# Patient Record
Sex: Male | Born: 1950
Health system: Southern US, Community
[De-identification: ages and names within clinical notes are randomized; demographics above are authoritative.]

## PROBLEM LIST (undated history)

## (undated) DIAGNOSIS — I639 Cerebral infarction, unspecified: Secondary | ICD-10-CM

## (undated) DIAGNOSIS — E785 Hyperlipidemia, unspecified: Secondary | ICD-10-CM

## (undated) DIAGNOSIS — I10 Essential (primary) hypertension: Secondary | ICD-10-CM

## (undated) DIAGNOSIS — H9190 Unspecified hearing loss, unspecified ear: Secondary | ICD-10-CM

## (undated) DIAGNOSIS — Z9189 Other specified personal risk factors, not elsewhere classified: Secondary | ICD-10-CM

## (undated) DIAGNOSIS — N189 Chronic kidney disease, unspecified: Secondary | ICD-10-CM

## (undated) DIAGNOSIS — E119 Type 2 diabetes mellitus without complications: Secondary | ICD-10-CM

## (undated) DIAGNOSIS — R7303 Prediabetes: Secondary | ICD-10-CM

## (undated) HISTORY — PX: BACK SURGERY: SHX140

---

## 2007-02-14 ENCOUNTER — Encounter: Payer: Self-pay | Admitting: Internal Medicine

## 2007-02-14 ENCOUNTER — Ambulatory Visit: Payer: Self-pay | Admitting: Internal Medicine

## 2007-02-14 ENCOUNTER — Ambulatory Visit (HOSPITAL_COMMUNITY): Admission: RE | Admit: 2007-02-14 | Discharge: 2007-02-14 | Payer: Self-pay | Admitting: Internal Medicine

## 2009-02-27 ENCOUNTER — Emergency Department (HOSPITAL_COMMUNITY): Admission: EM | Admit: 2009-02-27 | Discharge: 2009-02-27 | Payer: Self-pay | Admitting: Emergency Medicine

## 2010-02-28 ENCOUNTER — Ambulatory Visit (HOSPITAL_COMMUNITY): Admission: RE | Admit: 2010-02-28 | Discharge: 2010-02-28 | Payer: Self-pay | Admitting: Family Medicine

## 2010-04-28 ENCOUNTER — Ambulatory Visit (HOSPITAL_COMMUNITY): Admission: RE | Admit: 2010-04-28 | Discharge: 2010-04-28 | Payer: Self-pay | Admitting: Family Medicine

## 2010-11-15 NOTE — Op Note (Signed)
NAME:  Daryl Perkins, Daryl Perkins                ACCOUNT NO.:  [id number]   MEDICAL RECORD NO.:  [id number]          PATIENT TYPE:  AMB   LOCATION:  DAY                           FACILITY:  [facility]   PHYSICIAN:  R. Roetta Sessions, M.D. DATE OF BIRTH:  11/05/1950   DATE OF PROCEDURE:  02/14/2007  DATE OF DISCHARGE:                               OPERATIVE REPORT   PROCEDURE:  Colonoscopy with biopsy.   INDICATIONS FOR PROCEDURE:  60 year old African American gentleman sent  here per courtesy of Dr. Lilyan Punt for colorectal cancer screening.  He is devoid of any lower GI tract symptoms.  He has a family history of  colorectal neoplasia.  Colonoscopy is now being done.  This procedure  was discussed with the patient and family.  Potential risks, benefits  and alternatives have been reviewed.  He has never had his lower GI  tract imaged previously.   PROCEDURE NOTE:  O2 saturation, blood pressure, pulse and respirations  were monitored throughout the entire procedure, after sedation with  Versed 2 mg IV and Demerol 50 mg in a single dose.   INSTRUMENT:  Pentax videochip system.   FINDINGS:  Contingent from rectal exam revealed no abnormalities  __________   Prep was adequate.   COLON:  Colonic mucosa was surveyed from the rectosigmoid junction  through the left transverse and right colon to the appendiceal orifice.  Ileocecal valve and cecum __________ structures were well seen.   __________  the scope was slowly withdrawn.  All previous mentioned  mucosal surfaces were again seen.  The following abnormalities were  noted:  1. Left-sided diverticula.  There is a 4 mm polyp in the midascending      colon. This was cold biopsied/removed. As I mentioned, rectal      mucosa on retroflexion to the anal verge demonstrated no      abnormalities. The patient tolerated the procedure well throughout      the endoscopy.   IMPRESSION:  1. Normal rectum.  2. Left-sided diverticular diminutive polyp  ascending colon, removed      with cold biopsy technique.  Remainder of the colonic mucosa      appeared normal.   RECOMMENDATIONS:  1. Diverticulosis, literature provided to Mr. Meckes.  2. Follow-up on pathology.  3. Further recommendations to follow.      Jonathon Bellows, M.D.  Electronically Signed     RMR/MEDQ  D:  02/14/2007  T:  02/15/2007  Job:  [id number]   cc:   Lorin Picket A. Gerda Diss, MD  Fax: [phone]

## 2011-09-06 ENCOUNTER — Encounter (HOSPITAL_COMMUNITY): Payer: Self-pay

## 2011-09-06 ENCOUNTER — Other Ambulatory Visit: Payer: Self-pay

## 2011-09-06 ENCOUNTER — Emergency Department (HOSPITAL_COMMUNITY)
Admission: EM | Admit: 2011-09-06 | Discharge: 2011-09-06 | Disposition: A | Discharge status: Home / Self Care | Payer: Medicare Other | Attending: Emergency Medicine | Admitting: Emergency Medicine

## 2011-09-06 ENCOUNTER — Emergency Department (HOSPITAL_COMMUNITY): Payer: Medicare Other

## 2011-09-06 DIAGNOSIS — I498 Other specified cardiac arrhythmias: Secondary | ICD-10-CM | POA: Insufficient documentation

## 2011-09-06 DIAGNOSIS — N529 Male erectile dysfunction, unspecified: Secondary | ICD-10-CM | POA: Insufficient documentation

## 2011-09-06 LAB — BASIC METABOLIC PANEL
CO2: 24 mEq/L (ref 19–32)
Chloride: 98 mEq/L (ref 96–112)
GFR calc Af Amer: 52 mL/min — ABNORMAL LOW (ref >90)
Potassium: 3.9 mEq/L (ref 3.5–5.1)

## 2011-09-06 LAB — CBC
HCT: 36.4 % — ABNORMAL LOW (ref 39.0–52.0)
Hemoglobin: 12.2 g/dL — ABNORMAL LOW (ref 13.0–17.0)
WBC: 7.8 10*3/uL (ref 4.0–10.5)

## 2011-09-06 NOTE — ED Notes (Signed)
EMD currently at bedside. 

## 2011-09-06 NOTE — ED Notes (Signed)
Pt here with complaints of recently having problems with "erection" per pt. Pt states problem started a year ago and his wife is concerned he is cheating on her since he is unable to perform at home per pt. Denies any other difficulties.

## 2011-09-06 NOTE — ED Provider Notes (Signed)
History   This chart was scribed for Shelda Jakes, MD by Clarita Crane. The patient was seen in room APFT22/APFT22. Patient's care was started at 0904.    CSN: [id number]  Arrival date & time 09/06/11  0904   First MD Initiated Contact with Patient 09/06/11 [phone]      Chief Complaint  Patient presents with  . Erectile Dysfunction    (Consider location/radiation/quality/duration/timing/severity/associated sxs/prior treatment) HPI Daryl Perkins is a 61 y.o. male who presents to the Emergency Department complaining of intermittent erectile dysfunction onset 1 year ago and persistent since with no associated symptoms. Denies penile pain, discharge. Patient with no pertinent past medical history.   History reviewed. No pertinent past medical history.  History reviewed. No pertinent past surgical history.  No family history on file.  History  Substance Use Topics  . Smoking status: Never Smoker   . Smokeless tobacco: Not on file  . Alcohol Use: No      Review of Systems  Constitutional: Negative for fever and chills.  HENT: Negative for sore throat and rhinorrhea.   Respiratory: Negative for cough and shortness of breath.   Cardiovascular: Negative for chest pain.  Gastrointestinal: Negative for nausea, vomiting, abdominal pain and diarrhea.  Genitourinary: Negative for dysuria.       Erectile dysfunction.   Musculoskeletal: Negative for back pain.  Skin: Negative for rash.  Neurological: Negative for dizziness and weakness.    Allergies  Review of patient's allergies indicates no known allergies.  Home Medications  No current outpatient prescriptions on file.  BP 105/92  Pulse 59  Temp(Src) 97.4 F (36.3 C) (Oral)  Resp 16  SpO2 100%  Physical Exam  Nursing note and vitals reviewed. Constitutional: He is oriented to person, place, and time. He appears well-developed and well-nourished. No distress.  HENT:  Head: Normocephalic and atraumatic.  Eyes: EOM  are normal. Pupils are equal, round, and reactive to light.  Neck: Neck supple. No tracheal deviation present.  Cardiovascular: Normal rate and regular rhythm.  Exam reveals no gallop and no friction rub.   No murmur heard. Pulmonary/Chest: Effort normal. No respiratory distress. He has no wheezes. He has no rales.  Abdominal: Soft. Bowel sounds are normal. He exhibits no distension.  Genitourinary: Penis normal. No penile tenderness.       Chaperone present for exam. Testicles normal. Uncircumcised. No masses or hernia noted.   Musculoskeletal: Normal range of motion. He exhibits no edema.  Lymphadenopathy:    He has no cervical adenopathy.  Neurological: He is alert and oriented to person, place, and time. No cranial nerve deficit or sensory deficit.  Skin: Skin is warm and dry.  Psychiatric: He has a normal mood and affect. His behavior is normal.    ED Course  Procedures (including critical care time)  DIAGNOSTIC STUDIES: Oxygen Saturation is 100% on room air, normal by my interpretation.    COORDINATION OF CARE: 10:10AM- Patient informed of current plan for treatment and evaluation and agrees with plan at this time.    Date: 09/06/2011  Rate: 56  Rhythm: sinus bradycardia  QRS Axis: normal  Intervals: normal  ST/T Wave abnormalities: nonspecific ST changes  Conduction Disutrbances:none  Narrative Interpretation:   Old EKG Reviewed: none available    Labs Reviewed  BASIC METABOLIC PANEL - Abnormal; Notable for the following:    Sodium 133 (*)    Glucose, Bld 109 (*)    Creatinine, Ser 1.63 (*)    GFR calc non  Af Amer 45 (*)    GFR calc Af Amer 52 (*)    All other components within normal limits  CBC - Abnormal; Notable for the following:    Hemoglobin 12.2 (*)    HCT 36.4 (*)    All other components within normal limits   Dg Chest 2 View  09/06/2011  *RADIOLOGY REPORT*  Clinical Data: Erectile dysfunction.  CHEST - 2 VIEW  Comparison: 02/28/2010  Findings: Heart  and mediastinal contours are within normal limits. No focal opacities or effusions.  No acute bony abnormality. Degenerative changes in the thoracic spine.  IMPRESSION: No active cardiopulmonary disease.  Original Report Authenticated By: Cyndie Chime, M.D.   Results for orders placed during the hospital encounter of 09/06/11  BASIC METABOLIC PANEL      Component Value Range   Sodium 133 (*) 135 - 145 (mEq/L)   Potassium 3.9  3.5 - 5.1 (mEq/L)   Chloride 98  96 - 112 (mEq/L)   CO2 24  19 - 32 (mEq/L)   Glucose, Bld 109 (*) 70 - 99 (mg/dL)   BUN 18  6 - 23 (mg/dL)   Creatinine, Ser 4.54 (*) 0.50 - 1.35 (mg/dL)   Calcium 09.8  8.4 - 10.5 (mg/dL)   GFR calc non Af Amer 45 (*) >90 (mL/min)   GFR calc Af Amer 52 (*) >90 (mL/min)  CBC      Component Value Range   WBC 7.8  4.0 - 10.5 (K/uL)   RBC 4.22  4.22 - 5.81 (MIL/uL)   Hemoglobin 12.2 (*) 13.0 - 17.0 (g/dL)   HCT 11.9 (*) 14.7 - 52.0 (%)   MCV 86.3  78.0 - 100.0 (fL)   MCH 28.9  26.0 - 34.0 (pg)   MCHC 33.5  30.0 - 36.0 (g/dL)   RDW 82.9  56.2 - 13.0 (%)   Platelets 283  150 - 400 (K/uL)     1. Erectile dysfunction       MDM  Workup clinically suggestive of erectile dysfunction will need further evaluation to see if appropriate to start medicines like Viagra. GU exam was normal normal phallus uncircumcised no hernia no scrotal mass no discharge. No epididymal tenderness suggestive of epididymitis.      I personally performed the services described in this documentation, which was scribed in my presence. The recorded information has been reviewed and considered.     Shelda Jakes, MD 09/06/11 [phone]

## 2011-09-06 NOTE — Discharge Instructions (Signed)
Basic workup here without any specific findings EKG chest x-ray and basic labs without any significant abnormalities. Diagnosis of erectile dysfunction we'll require further workup by your primary care physician. Make an appointment with them to be seen shortly. If appropriate they may be able to recommend Viagra-type medicine to help.  Erectile Dysfunction Erectile dysfunction (ED) is the inability to get a good enough erection to have sexual intercourse. ED may involve:  Inability to get an erection.   Lack of enough hardness to allow penetration.   Loss of the erection before sex is finished.   Premature ejaculation.   Any combination of these problems if they occur more than 25% of the time.  CAUSES  Certain drugs, such as:   Pain relievers.   Antihistamines.   Antidepressants.   Blood pressure medicines.   Water pills.   Ulcer medicines.   Muscle relaxants.   Illegal drugs.   Excessive drinking.   Psychological causes, such as:   Anxiety.   Depression.   Sadness.   Exhaustion.   Performance fear.   Stress.   Physical causes, such as:   Artery problems. This may include diabetes, smoking, liver disease, or atherosclerosis.   High blood pressure.   Hormonal problems, such as low testosterone.   Obesity.   Nerve problems. This may include back or pelvic injuries, diabetes, multiple sclerosis, Parkinson's disease, or some surgeries.  SYMPTOMS  Inability to get an erection.   Lack of enough hardness to allow penetration.   Loss of the erection before sex is finished.   Premature ejaculation.   Normal erections at some times, but with frequent unsatisfactory episodes.   Orgasms that are not satisfactory in sensation or frequency.   Low sexual satisfaction in either partner because of erection problems.   A curved penis occurring with erection. The curve may cause pain or may be too curved to allow for intercourse.   Never having nighttime  erections.  DIAGNOSIS Your caregiver can often diagnose this condition by:  Performing a physical exam to find other diseases or specific problems with the penis.   Asking you detailed questions about the problem.   Performing blood tests to check for diabetes or to measure hormone levels.   Performing urine tests to find other underlying health conditions.   Performing an ultrasound to check for scarring.   Performing a test to check blood flow to the penis.   Doing a sleep study at home to measure nighttime erections.  TREATMENT   You may be prescribed medicines by mouth.   You may be given medicine injections into the penis.   You may be prescribed a vacuum pump with a ring.   Penile implant surgery may be performed. You may receive:   An inflatable implant.   A semi-rigid implant.   Blood vessel surgery may be performed.  HOME CARE INSTRUCTIONS  Take all medicine as directed by your caregiver. Do not take any other medicines without talking to your caregiver first.   Follow your caregiver's directions for specific treatments as prescribed.   Follow up with your caregiver as directed.  Document Released: 06/16/2000 Document Revised: 06/08/2011 Document Reviewed: 10/09/2010 [facility] Patient Information 2012 [place], [place].

## 2012-09-05 ENCOUNTER — Encounter (HOSPITAL_COMMUNITY): Payer: Self-pay

## 2012-09-05 ENCOUNTER — Encounter (HOSPITAL_COMMUNITY): Payer: Self-pay | Admitting: Pharmacy Technician

## 2012-09-05 ENCOUNTER — Encounter (HOSPITAL_COMMUNITY)
Admission: RE | Admit: 2012-09-05 | Discharge: 2012-09-05 | Disposition: A | Discharge status: Home / Self Care | Payer: Medicare Other | Source: Ambulatory Visit | Attending: General Surgery | Admitting: General Surgery

## 2012-09-05 HISTORY — DX: Unspecified hearing loss, unspecified ear: H91.90

## 2012-09-05 HISTORY — DX: Essential (primary) hypertension: I10

## 2012-09-05 LAB — CBC WITH DIFFERENTIAL/PLATELET
Eosinophils Relative: 3 % (ref 0–5)
HCT: 34.5 % — ABNORMAL LOW (ref 39.0–52.0)
Hemoglobin: 11.7 g/dL — ABNORMAL LOW (ref 13.0–17.0)
Lymphocytes Relative: 30 % (ref 12–46)
Lymphs Abs: 2.2 10*3/uL (ref 0.7–4.0)
MCV: 86.5 fL (ref 78.0–100.0)
Monocytes Absolute: 0.5 10*3/uL (ref 0.1–1.0)
Platelets: 228 10*3/uL (ref 150–400)
RBC: 3.99 MIL/uL — ABNORMAL LOW (ref 4.22–5.81)
WBC: 7.2 10*3/uL (ref 4.0–10.5)

## 2012-09-05 LAB — SURGICAL PCR SCREEN: Staphylococcus aureus: NEGATIVE

## 2012-09-05 LAB — BASIC METABOLIC PANEL
CO2: 26 mEq/L (ref 19–32)
Chloride: 100 mEq/L (ref 96–112)
Potassium: 4.1 mEq/L (ref 3.5–5.1)
Sodium: 135 mEq/L (ref 135–145)

## 2012-09-05 NOTE — Patient Instructions (Addendum)
Daryl Perkins  09/05/2012   Your procedure is scheduled on:  09/09/12  Report to [facility] at  615 AM.  Call this number if you have problems the morning of surgery: [phone]   Remember:   Do not eat food or drink liquids after midnight.   Take these medicines the morning of surgery with A SIP OF WATER: none   Do not wear jewelry, make-up or nail polish.  Do not wear lotions, powders, or perfumes.   Do not shave 48 hours prior to surgery. Men may shave face and neck.  Do not bring valuables to the hospital.  Contacts, dentures or bridgework may not be worn into surgery.  Leave suitcase in the car. After surgery it may be brought to your room.  For patients admitted to the hospital, checkout time is 11:00 AM the day of discharge.   Patients discharged the day of surgery will not be allowed to drive  home.  Name and phone number of your driver: family Special Instructions: Shower using CHG 2 nights before surgery and the night before surgery.  If you shower the day of surgery use CHG.  Use special wash - you have one bottle of CHG for all showers.  You should use approximately 1/3 of the bottle for each shower.   Please read over the following fact sheets that you were given: Pain Booklet, Coughing and Deep Breathing, MRSA Information, Surgical Site Infection Prevention, Anesthesia Post-op Instructions and Care and Recovery After Surgery Hernia A hernia occurs when an internal organ pushes out through a weak spot in the abdominal wall. Hernias most commonly occur in the groin and around the navel. Hernias often can be pushed back into place (reduced). Most hernias tend to get worse over time. Some abdominal hernias can get stuck in the opening (irreducible or incarcerated hernia) and cannot be reduced. An irreducible abdominal hernia which is tightly squeezed into the opening is at risk for impaired blood supply (strangulated hernia). A strangulated hernia is a medical emergency.  Because of the risk for an irreducible or strangulated hernia, surgery may be recommended to repair a hernia. CAUSES   Heavy lifting.  Prolonged coughing.  Straining to have a bowel movement.  A cut (incision) made during an abdominal surgery. HOME CARE INSTRUCTIONS   Bed rest is not required. You may continue your normal activities.  Avoid lifting more than 10 pounds (4.5 kg) or straining.  Cough gently. If you are a smoker it is best to stop. Even the best hernia repair can break down with the continual strain of coughing. Even if you do not have your hernia repaired, a cough will continue to aggravate the problem.  Do not wear anything tight over your hernia. Do not try to keep it in with an outside bandage or truss. These can damage abdominal contents if they are trapped within the hernia sac.  Eat a normal diet.  Avoid constipation. Straining over long periods of time will increase hernia size and encourage breakdown of repairs. If you cannot do this with diet alone, stool softeners may be used. SEEK IMMEDIATE MEDICAL CARE IF:   You have a fever.  You develop increasing abdominal pain.  You feel nauseous or vomit.  Your hernia is stuck outside the abdomen, looks discolored, feels hard, or is tender.  You have any changes in your bowel habits or in the hernia that are unusual for you.  You have increased pain or swelling  around the hernia.  You cannot push the hernia back in place by applying gentle pressure while lying down. MAKE SURE YOU:   Understand these instructions.  Will watch your condition.  Will get help right away if you are not doing well or get worse. Document Released: 06/19/2005 Document Revised: 09/11/2011 Document Reviewed: 02/06/2008 [facility] Patient Information 2013 [place], [place]. PATIENT INSTRUCTIONS POST-ANESTHESIA  IMMEDIATELY FOLLOWING SURGERY:  Do not drive or operate machinery for the first twenty four hours after surgery.  Do not make  any important decisions for twenty four hours after surgery or while taking narcotic pain medications or sedatives.  If you develop intractable nausea and vomiting or a severe headache please notify your doctor immediately.  FOLLOW-UP:  Please make an appointment with your surgeon as instructed. You do not need to follow up with anesthesia unless specifically instructed to do so.  WOUND CARE INSTRUCTIONS (if applicable):  Keep a dry clean dressing on the anesthesia/puncture wound site if there is drainage.  Once the wound has quit draining you may leave it open to air.  Generally you should leave the bandage intact for twenty four hours unless there is drainage.  If the epidural site drains for more than 36-48 hours please call the anesthesia department.  QUESTIONS?:  Please feel free to call your physician or the hospital operator if you have any questions, and they will be happy to assist you.

## 2012-09-05 NOTE — H&P (Signed)
  NTS SOAP Note  Vital Signs:  Vitals as of: 09/05/2012: Systolic 134: Diastolic 57: Heart Rate 55: Temp 99.17F: Height 62ft 11in: Weight 200Lbs 0 Ounces: Pain Level 10: BMI 28  BMI : 27.89 kg/m2  Subjective: This 7 Years 67 Months old Male presents forsymptoms of    HERNIA: ,.  Has had a right inguinal hernia for some time, but is increasing in size and causing him discomfort.  Made worse with straining.  Review of Symptoms:  Constitutional:unremarkable   Head:unremarkable    Eyes:unremarkable   Nose/Mouth/Throat:unremarkable Cardiovascular:  unremarkable   Respiratory:unremarkable   Gastrointestinal:  unremarkable   Genitourinary:unremarkable       back Skin:unremarkable Hematolgic/Lymphatic:unremarkable     Allergic/Immunologic:unremarkable     Past Medical History:    Reviewed   Past Medical History  Surgical History: back surgeries Medical Problems:  Diabetes, High Blood pressure, High cholesterol Allergies: nkda Medications: lisinopri, pravastatin, metformin, doxazosin, metoprolol, dyazide   Social History:Reviewed  Social History  Preferred Language: English Race:  Black or African American Ethnicity: Not Hispanic / Latino Age: 62 Years 4 Months Marital Status:  M Alcohol:  No Recreational drug(s):  No   Smoking Status: Never smoker reviewed on 09/05/2012 Functional Status reviewed on mm/dd/yyyy ------------------------------------------------ Bathing: Normal Cooking: Normal Dressing: Normal Driving: Normal Eating: Normal Managing Meds: Normal Oral Care: Normal Shopping: Normal Toileting: Normal Transferring: Normal Walking: Normal Cognitive Status reviewed on mm/dd/yyyy ------------------------------------------------ Attention: Normal Decision Making: Normal Language: Normal Memory: Normal Motor: Normal Perception: Normal Problem Solving: Normal Visual and Spatial: Normal   Family History:   Reviewed   Family History  Is there a family history of:DM, HTN, reflux disease    Objective Information: General:  Well appearing, well nourished in no distress. Neck:  Supple without lymphadenopathy.  Heart:  RRR, no murmur Lungs:    CTA bilaterally, no wheezes, rhonchi, rales.  Breathing unlabored. Abdomen:Soft, NT/ND, no HSM, no masses.  Reducible right inguinal hernia, small left inguinal hernia [id number]    Assessment:Right inguinal hernia  Diagnosis &amp; Procedure:    Plan:Scheduled for right inguinal herniorrhaphy for  09/09/12.   Patient Education:Alternative treatments to surgery were discussed with patient (and family).  Risks and benefits  of procedure including pain and recurrence of the hernia were fully explained to the patient (and family) who gave informed consent. Patient/family questions were addressed.  Follow-up:Pending Surgery

## 2012-09-09 ENCOUNTER — Encounter (HOSPITAL_COMMUNITY): Payer: Self-pay | Admitting: Anesthesiology

## 2012-09-09 ENCOUNTER — Encounter (HOSPITAL_COMMUNITY)
Admission: RE | Disposition: A | Discharge status: Home / Self Care | Payer: Self-pay | Source: Ambulatory Visit | Attending: General Surgery

## 2012-09-09 ENCOUNTER — Ambulatory Visit (HOSPITAL_COMMUNITY): Payer: Medicare Other | Admitting: Anesthesiology

## 2012-09-09 ENCOUNTER — Ambulatory Visit (HOSPITAL_COMMUNITY)
Admission: RE | Admit: 2012-09-09 | Discharge: 2012-09-09 | Disposition: A | Discharge status: Home / Self Care | Payer: Medicare Other | Source: Ambulatory Visit | Attending: General Surgery | Admitting: General Surgery

## 2012-09-09 ENCOUNTER — Encounter (HOSPITAL_COMMUNITY): Payer: Self-pay | Admitting: *Deleted

## 2012-09-09 DIAGNOSIS — K4090 Unilateral inguinal hernia, without obstruction or gangrene, not specified as recurrent: Secondary | ICD-10-CM | POA: Insufficient documentation

## 2012-09-09 DIAGNOSIS — Z01812 Encounter for preprocedural laboratory examination: Secondary | ICD-10-CM | POA: Insufficient documentation

## 2012-09-09 HISTORY — DX: Other specified personal risk factors, not elsewhere classified: Z91.89

## 2012-09-09 HISTORY — PX: INGUINAL HERNIA REPAIR: SHX194

## 2012-09-09 HISTORY — PX: INSERTION OF MESH: SHX5868

## 2012-09-09 HISTORY — DX: Type 2 diabetes mellitus without complications: E11.9

## 2012-09-09 LAB — GLUCOSE, CAPILLARY
Glucose-Capillary: 109 mg/dL — ABNORMAL HIGH (ref 70–99)
Glucose-Capillary: 90 mg/dL (ref 70–99)

## 2012-09-09 SURGERY — REPAIR, HERNIA, INGUINAL, ADULT
Anesthesia: Spinal | Site: Groin | Laterality: Right | Wound class: Clean

## 2012-09-09 MED ORDER — KETOROLAC TROMETHAMINE 30 MG/ML IJ SOLN
INTRAMUSCULAR | Status: AC
  Filled 2012-09-09: qty 1

## 2012-09-09 MED ORDER — LIDOCAINE IN DEXTROSE 5-7.5 % IV SOLN
INTRAVENOUS | Status: DC | PRN
  Administered 2012-09-09: 2 mL via INTRATHECAL

## 2012-09-09 MED ORDER — CEFAZOLIN SODIUM-DEXTROSE 2-3 GM-% IV SOLR
INTRAVENOUS | Status: AC
  Filled 2012-09-09: qty 50

## 2012-09-09 MED ORDER — LIDOCAINE HCL (CARDIAC) 10 MG/ML IV SOLN
INTRAVENOUS | Status: DC | PRN
  Administered 2012-09-09: 50 mg via INTRAVENOUS

## 2012-09-09 MED ORDER — EPHEDRINE SULFATE 50 MG/ML IJ SOLN
INTRAMUSCULAR | Status: AC
  Filled 2012-09-09: qty 1

## 2012-09-09 MED ORDER — PROPOFOL 10 MG/ML IV EMUL
INTRAVENOUS | Status: AC
  Filled 2012-09-09: qty 20

## 2012-09-09 MED ORDER — LACTATED RINGERS IV SOLN
INTRAVENOUS | Status: DC
  Administered 2012-09-09: 07:00:00 via INTRAVENOUS

## 2012-09-09 MED ORDER — LACTATED RINGERS IV SOLN
INTRAVENOUS | Status: DC | PRN
  Administered 2012-09-09: 07:00:00 via INTRAVENOUS

## 2012-09-09 MED ORDER — ENOXAPARIN SODIUM 40 MG/0.4ML ~~LOC~~ SOLN
SUBCUTANEOUS | Status: AC
  Filled 2012-09-09: qty 0.4

## 2012-09-09 MED ORDER — CEFAZOLIN SODIUM-DEXTROSE 2-3 GM-% IV SOLR: 2.0000 g | INTRAVENOUS | Status: DC

## 2012-09-09 MED ORDER — MIDAZOLAM HCL 2 MG/2ML IJ SOLN
INTRAMUSCULAR | Status: AC
  Filled 2012-09-09: qty 2

## 2012-09-09 MED ORDER — LIDOCAINE HCL (PF) 1 % IJ SOLN
INTRAMUSCULAR | Status: AC
  Filled 2012-09-09: qty 5

## 2012-09-09 MED ORDER — CHLORHEXIDINE GLUCONATE 4 % EX LIQD: 1.0000 "application " | Freq: Once | CUTANEOUS | Status: DC

## 2012-09-09 MED ORDER — HYDROCODONE-ACETAMINOPHEN 5-325 MG PO TABS: 1.0000 | ORAL_TABLET | ORAL | Status: DC | PRN

## 2012-09-09 MED ORDER — ENOXAPARIN SODIUM 40 MG/0.4ML ~~LOC~~ SOLN
40.0000 mg | Freq: Once | SUBCUTANEOUS | Status: AC
  Administered 2012-09-09: 40 mg via SUBCUTANEOUS

## 2012-09-09 MED ORDER — FENTANYL CITRATE 0.05 MG/ML IJ SOLN
25.0000 ug | INTRAMUSCULAR | Status: DC | PRN
  Administered 2012-09-09 (×2): 50 ug via INTRAVENOUS

## 2012-09-09 MED ORDER — FENTANYL CITRATE 0.05 MG/ML IJ SOLN
INTRAMUSCULAR | Status: AC
  Filled 2012-09-09: qty 2

## 2012-09-09 MED ORDER — CEFAZOLIN SODIUM-DEXTROSE 2-3 GM-% IV SOLR
INTRAVENOUS | Status: DC | PRN
  Administered 2012-09-09: 2 g via INTRAVENOUS

## 2012-09-09 MED ORDER — MIDAZOLAM HCL 2 MG/2ML IJ SOLN
1.0000 mg | INTRAMUSCULAR | Status: DC | PRN
  Administered 2012-09-09: 2 mg via INTRAVENOUS

## 2012-09-09 MED ORDER — BUPIVACAINE HCL (PF) 0.5 % IJ SOLN
INTRAMUSCULAR | Status: DC | PRN
  Administered 2012-09-09: 10 mL

## 2012-09-09 MED ORDER — GLYCOPYRROLATE 0.2 MG/ML IJ SOLN
INTRAMUSCULAR | Status: DC | PRN
  Administered 2012-09-09: 0.2 mg via INTRAVENOUS

## 2012-09-09 MED ORDER — KETOROLAC TROMETHAMINE 30 MG/ML IJ SOLN
30.0000 mg | Freq: Once | INTRAMUSCULAR | Status: AC
  Administered 2012-09-09: 30 mg via INTRAVENOUS

## 2012-09-09 MED ORDER — SODIUM CHLORIDE 0.9 % IR SOLN
Status: DC | PRN
  Administered 2012-09-09: 1000 mL

## 2012-09-09 MED ORDER — GLYCOPYRROLATE 0.2 MG/ML IJ SOLN
INTRAMUSCULAR | Status: AC
  Filled 2012-09-09: qty 1

## 2012-09-09 MED ORDER — ONDANSETRON HCL 4 MG/2ML IJ SOLN: 4.0000 mg | Freq: Once | INTRAMUSCULAR | Status: DC | PRN

## 2012-09-09 MED ORDER — FENTANYL CITRATE 0.05 MG/ML IJ SOLN
INTRAMUSCULAR | Status: DC | PRN
  Administered 2012-09-09: 20 ug via INTRATHECAL
  Administered 2012-09-09: 30 ug via INTRAVENOUS
  Administered 2012-09-09 (×2): 25 ug via INTRAVENOUS

## 2012-09-09 MED ORDER — ARTIFICIAL TEARS OP OINT
TOPICAL_OINTMENT | OPHTHALMIC | Status: AC
  Filled 2012-09-09: qty 3.5

## 2012-09-09 MED ORDER — EPHEDRINE SULFATE 50 MG/ML IJ SOLN
INTRAMUSCULAR | Status: DC | PRN
  Administered 2012-09-09 (×2): 10 mg via INTRAVENOUS

## 2012-09-09 MED ORDER — PROPOFOL INFUSION 10 MG/ML OPTIME
INTRAVENOUS | Status: DC | PRN
  Administered 2012-09-09: 50 ug/kg/min via INTRAVENOUS

## 2012-09-09 MED ORDER — BUPIVACAINE HCL (PF) 0.5 % IJ SOLN
INTRAMUSCULAR | Status: AC
  Filled 2012-09-09: qty 30

## 2012-09-09 SURGICAL SUPPLY — 44 items
ADH SKN CLS APL DERMABOND .7 (GAUZE/BANDAGES/DRESSINGS) ×1
BAG HAMPER (MISCELLANEOUS) ×2 IMPLANT
CLOTH BEACON ORANGE TIMEOUT ST (SAFETY) ×2 IMPLANT
COVER LIGHT HANDLE STERIS (MISCELLANEOUS) ×4 IMPLANT
DECANTER SPIKE VIAL GLASS SM (MISCELLANEOUS) ×2 IMPLANT
DERMABOND ADVANCED (GAUZE/BANDAGES/DRESSINGS) ×1
DERMABOND ADVANCED .7 DNX12 (GAUZE/BANDAGES/DRESSINGS) ×1 IMPLANT
DRAIN PENROSE 18X.75 LTX STRL (MISCELLANEOUS) ×2 IMPLANT
ELECT REM PT RETURN 9FT ADLT (ELECTROSURGICAL) ×2
ELECTRODE REM PT RTRN 9FT ADLT (ELECTROSURGICAL) ×1 IMPLANT
FORMALIN 10 PREFIL 120ML (MISCELLANEOUS) IMPLANT
GLOVE BIO SURGEON STRL SZ7.5 (GLOVE) ×2 IMPLANT
GLOVE BIOGEL PI IND STRL 7.0 (GLOVE) IMPLANT
GLOVE BIOGEL PI IND STRL 7.5 (GLOVE) IMPLANT
GLOVE BIOGEL PI INDICATOR 7.0 (GLOVE) ×1
GLOVE BIOGEL PI INDICATOR 7.5 (GLOVE) ×1
GLOVE ECLIPSE 7.0 STRL STRAW (GLOVE) ×1 IMPLANT
GLOVE SS BIOGEL STRL SZ 6.5 (GLOVE) IMPLANT
GLOVE SUPERSENSE BIOGEL SZ 6.5 (GLOVE) ×1
GOWN STRL REIN XL XLG (GOWN DISPOSABLE) ×6 IMPLANT
INST SET MINOR GENERAL (KITS) ×2 IMPLANT
KIT ROOM TURNOVER APOR (KITS) ×2 IMPLANT
MANIFOLD NEPTUNE II (INSTRUMENTS) ×2 IMPLANT
MESH HERNIA 1.6X1.9 PLUG LRG (Mesh General) IMPLANT
MESH HERNIA PLUG LRG (Mesh General) ×1 IMPLANT
NDL HYPO 25X1 1.5 SAFETY (NEEDLE) ×1 IMPLANT
NEEDLE HYPO 25X1 1.5 SAFETY (NEEDLE) ×2 IMPLANT
NS IRRIG 1000ML POUR BTL (IV SOLUTION) ×2 IMPLANT
PACK MINOR (CUSTOM PROCEDURE TRAY) ×2 IMPLANT
PAD ARMBOARD 7.5X6 YLW CONV (MISCELLANEOUS) ×2 IMPLANT
SET BASIN LINEN APH (SET/KITS/TRAYS/PACK) ×2 IMPLANT
SOL PREP PROV IODINE SCRUB 4OZ (MISCELLANEOUS) ×2 IMPLANT
SUT NOVA NAB GS-22 2 2-0 T-19 (SUTURE) ×2 IMPLANT
SUT NOVAFIL NAB HGS22 2-0 30IN (SUTURE) ×1 IMPLANT
SUT SILK 3 0 (SUTURE)
SUT SILK 3-0 18XBRD TIE 12 (SUTURE) IMPLANT
SUT VIC AB 2-0 CT1 27 (SUTURE) ×2
SUT VIC AB 2-0 CT1 TAPERPNT 27 (SUTURE) ×1 IMPLANT
SUT VIC AB 3-0 SH 27 (SUTURE) ×2
SUT VIC AB 3-0 SH 27X BRD (SUTURE) ×1 IMPLANT
SUT VIC AB 4-0 PS2 27 (SUTURE) ×2 IMPLANT
SUT VICRYL AB 3 0 TIES (SUTURE) ×1 IMPLANT
SYR CONTROL 10ML LL (SYRINGE) ×2 IMPLANT
TOWEL OR 17X26 4PK STRL BLUE (TOWEL DISPOSABLE) ×1 IMPLANT

## 2012-09-09 NOTE — Preoperative (Signed)
Beta Blockers   Reason not to administer Beta Blockers:Not Applicable 

## 2012-09-09 NOTE — Op Note (Signed)
Patient:  Daryl Perkins  DOB:  03-26-1951  MRN:  [id number]   Preop Diagnosis:  Right inguinal hernia  Postop Diagnosis:  Same  Procedure:  Right inguinal herniorrhaphy  Surgeon:  Franky Macho, M.D.  Anes:  Spinal  Indications:  Patient is a 62 year old black male who presents with a symptomatic right inguinal hernia. The risks and benefits of the procedure including bleeding, infection, the possibility of pain, and the possibility of recurrence of the hernia were fully explained to the patient, who gave informed consent.  Procedure note:  The patient was placed in the supine position after spinal anesthesia was administered. The right groin region was prepped and draped using usual sterile technique with Betadine. Surgical site confirmation was performed.  An incision was made in the right groin region down to the external oblique aponeuroses. The aponeuroses was incised to the external ring. A Penrose drain was placed around the spermatic cord. The vas deferens was noted within the spermatic cord. The ilioinguinal nerve was identified and retracted superiorly from the operative field. A lipoma of the cord was excised and disposed of. An indirect hernia sac was found. This was freed away from the spermatic cord up to the peritoneal reflection and inverted. A large size Bard PerFix plug was then inserted and secured to the transversalis fascia using a 2-0 Novafil interrupted suture. An onlay patch was then placed along the floor of inguinal canal and secured superiorly to the conjoined tendon and inferiorly to the shelving edge of Poupart's ligament using 2-0 Novafil interrupted sutures. The internal ring was recreated using a 2-0 Novafil interrupted suture. The external oblique aponeuroses was reapproximated using a 2-0 Vicryl running suture. Subcutaneous layer was reapproximated using 3-0 Vicryl interrupted suture. The skin was closed a 4-0 Vicryl subcuticular suture. 0.5% Sensorcaine was  instilled the surrounding wound. Dermabond was then applied.  All tape and needle counts were correct at the end of the procedure. Patient was transferred to PACU in stable condition.  Complications:  None  EBL:  Minimal  Specimen:  None

## 2012-09-09 NOTE — Anesthesia Postprocedure Evaluation (Signed)
  Anesthesia Post-op Note  Patient: Daryl Perkins  Procedure(s) Performed: Procedure(s): HERNIA REPAIR INGUINAL ADULT (Right)  Patient Location: PACU  Anesthesia Type:Spinal  Level of Consciousness: awake, alert , oriented and patient cooperative  Airway and Oxygen Therapy: Patient Spontanous Breathing  Post-op Pain: none  Post-op Assessment: Post-op Vital signs reviewed, Patient's Cardiovascular Status Stable, Respiratory Function Stable, Patent Airway, No signs of Nausea or vomiting and Pain level controlled  Post-op Vital Signs: Reviewed and stable  Complications: No apparent anesthesia complications

## 2012-09-09 NOTE — Interval H&P Note (Signed)
History and Physical Interval Note:  09/09/2012 7:26 AM  Daryl Perkins  has presented today for surgery, with the diagnosis of right inguinal hernia  The various methods of treatment have been discussed with the patient and family. After consideration of risks, benefits and other options for treatment, the patient has consented to  Procedure(s): HERNIA REPAIR INGUINAL ADULT (Right) as a surgical intervention .  The patient's history has been reviewed, patient examined, no change in status, stable for surgery.  I have reviewed the patient's chart and labs.  Questions were answered to the patient's satisfaction.     [name]

## 2012-09-09 NOTE — Progress Notes (Signed)
Awake. Denies pain. No scrotal edema.

## 2012-09-09 NOTE — Anesthesia Preprocedure Evaluation (Signed)
Anesthesia Evaluation  Patient identified by MRN, date of birth, ID band Patient awake    Reviewed: Allergy & Precautions, H&P , NPO status , Patient's Chart, lab work & pertinent test results  Airway Mallampati: I TM Distance: >3 FB     Dental  (+) Edentulous Upper, Poor Dentition, Edentulous Lower, Chipped and Loose,    Pulmonary neg pulmonary ROS,  breath sounds clear to auscultation        Cardiovascular hypertension, Pt. on medications Rhythm:Regular Rate:Abnormal     Neuro/Psych    GI/Hepatic negative GI ROS,   Endo/Other  diabetes, Well Controlled, Type 2, Oral Hypoglycemic Agents  Renal/GU      Musculoskeletal   Abdominal   Peds  Hematology   Anesthesia Other Findings   Reproductive/Obstetrics                           Anesthesia Physical Anesthesia Plan  ASA: III  Anesthesia Plan: Spinal   Post-op Pain Management:    Induction: Intravenous  Airway Management Planned: Nasal Cannula  Additional Equipment:   Intra-op Plan:   Post-operative Plan:   Informed Consent: I have reviewed the patients History and Physical, chart, labs and discussed the procedure including the risks, benefits and alternatives for the proposed anesthesia with the patient or authorized representative who has indicated his/her understanding and acceptance.     Plan Discussed with:   Anesthesia Plan Comments:         Anesthesia Quick Evaluation

## 2012-09-09 NOTE — Anesthesia Procedure Notes (Addendum)
Spinal  Patient location during procedure: OR Start time: 09/09/2012 7:46 AM Staffing CRNA/Resident: ANDRAZA, Bridgid Printz L Preanesthetic Checklist Completed: patient identified, site marked, surgical consent, pre-op evaluation, timeout performed, IV checked, risks and benefits discussed and monitors and equipment checked Spinal Block Patient position: right lateral decubitus Prep: Betadine Patient monitoring: heart rate, cardiac monitor, continuous pulse ox and blood pressure Approach: right paramedian Location: L3-4 Injection technique: single-shot Needle Needle type: Spinocan  Needle gauge: 22 G Needle length: 9 cm Assessment Sensory level: T8 Additional Notes ATTEMPTS:1 TRAY ZO:[id number] TRAY EXPIRATION DATE:06/2013 Lidocaine 5% 2ml, fentanyl 20 mcg injected intrathecally at 0746; patient tolerated well.  Date/Time: 09/09/2012 7:28 AM Performed by: Carolyne Littles, Alister Staver L Pre-anesthesia Checklist: Patient identified, Patient being monitored, Emergency Drugs available, Timeout performed and Suction available Oxygen Delivery Method: Non-rebreather mask

## 2012-09-09 NOTE — Transfer of Care (Signed)
Immediate Anesthesia Transfer of Care Note  Patient: Daryl Perkins  Procedure(s) Performed: Procedure(s): HERNIA REPAIR INGUINAL ADULT (Right)  Patient Location: PACU  Anesthesia Type:Spinal  Level of Consciousness: awake, alert , oriented and patient cooperative  Airway & Oxygen Therapy: Patient Spontanous Breathing  Post-op Assessment: Report given to PACU RN, Post -op Vital signs reviewed and stable and Patient moving all extremities X 4  Post vital signs: Reviewed and stable  Complications: No apparent anesthesia complications

## 2012-09-09 NOTE — Progress Notes (Signed)
Diet coke given to drink. Tolerated well. 

## 2012-09-11 ENCOUNTER — Encounter (HOSPITAL_COMMUNITY): Payer: Self-pay | Admitting: General Surgery

## 2013-01-27 ENCOUNTER — Other Ambulatory Visit: Payer: Self-pay | Admitting: *Deleted

## 2013-01-27 MED ORDER — SILDENAFIL CITRATE 50 MG PO TABS: ORAL_TABLET | ORAL | Status: DC

## 2013-06-19 ENCOUNTER — Encounter: Payer: Self-pay | Admitting: Family Medicine

## 2013-07-07 ENCOUNTER — Telehealth: Payer: Self-pay | Admitting: Family Medicine

## 2013-07-07 ENCOUNTER — Other Ambulatory Visit: Payer: Self-pay | Admitting: *Deleted

## 2013-07-07 NOTE — Telephone Encounter (Signed)
Spoke with patient and told him he needed an office visit. I transferred patient up front to make an appt.

## 2013-07-07 NOTE — Telephone Encounter (Signed)
See chart for refills that pt dropped off for [employer]

## 2013-07-10 ENCOUNTER — Ambulatory Visit (INDEPENDENT_AMBULATORY_CARE_PROVIDER_SITE_OTHER): Payer: Medicare Other | Admitting: Family Medicine

## 2013-07-10 ENCOUNTER — Encounter: Payer: Self-pay | Admitting: Family Medicine

## 2013-07-10 VITALS — BP 110/80 | Ht 70.0 in | Wt 199.0 lb

## 2013-07-10 DIAGNOSIS — Z125 Encounter for screening for malignant neoplasm of prostate: Secondary | ICD-10-CM

## 2013-07-10 DIAGNOSIS — E785 Hyperlipidemia, unspecified: Secondary | ICD-10-CM

## 2013-07-10 DIAGNOSIS — Z79899 Other long term (current) drug therapy: Secondary | ICD-10-CM

## 2013-07-10 DIAGNOSIS — E119 Type 2 diabetes mellitus without complications: Secondary | ICD-10-CM

## 2013-07-10 DIAGNOSIS — I10 Essential (primary) hypertension: Secondary | ICD-10-CM

## 2013-07-10 LAB — POCT GLYCOSYLATED HEMOGLOBIN (HGB A1C): Hemoglobin A1C: 6

## 2013-07-10 MED ORDER — DOXAZOSIN MESYLATE 4 MG PO TABS: 4.0000 mg | ORAL_TABLET | Freq: Every day | ORAL | Status: DC

## 2013-07-10 MED ORDER — TRIAMTERENE-HCTZ 37.5-25 MG PO TABS: 1.0000 | ORAL_TABLET | Freq: Every day | ORAL | Status: DC

## 2013-07-10 MED ORDER — LISINOPRIL 5 MG PO TABS: 5.0000 mg | ORAL_TABLET | Freq: Every day | ORAL | Status: DC

## 2013-07-10 MED ORDER — METFORMIN HCL 500 MG PO TABS: 500.0000 mg | ORAL_TABLET | Freq: Every day | ORAL | Status: DC

## 2013-07-10 MED ORDER — METOPROLOL TARTRATE 50 MG PO TABS: 50.0000 mg | ORAL_TABLET | Freq: Two times a day (BID) | ORAL | Status: DC

## 2013-07-10 MED ORDER — PRAVASTATIN SODIUM 20 MG PO TABS: 20.0000 mg | ORAL_TABLET | Freq: Every day | ORAL | Status: DC

## 2013-07-10 NOTE — Progress Notes (Signed)
   Subjective:    Patient ID: Daryl Perkins, male    DOB: 05-11-1951, 63 y.o.   MRN: [id number]  Diabetes He presents for his follow-up diabetic visit. He has type 2 diabetes mellitus. His disease course has been stable. There are no hypoglycemic associated symptoms. There are no diabetic associated symptoms. There are no hypoglycemic complications. Symptoms are stable. There are no diabetic complications. There are no known risk factors for coronary artery disease. Current diabetic treatment includes oral agent (monotherapy). He is compliant with treatment all of the time.  Patient has no concerns at this time. A1C is 6.0 today. Patient not checking his own sugars at this time. Mostly watching his diet.  Results for orders placed in visit on 07/10/13  POCT GLYCOSYLATED HEMOGLOBIN (HGB A1C)      Result Value Range   Hemoglobin A1C 6.0     Good control  Walks regularly  Compliant with medicine for blood pressure. Has cut his salt intake down. Does not miss a dose of medication. Blood pressure decent when checked elsewhere.  Patient is on cholesterol medicine. No obvious side effects. Compliant with it. Blood work has not been checked for some time.  Review of Systems No headache no chest pain no back pain no abdominal pain no change in bowel habits no blood in stool ROS otherwise negative    Objective:   Physical Exam  Alert no apparent distress. Vitals stable. HEENT normal. Lungs clear. Heart regular rate and rhythm. Ankles without edema. See foot exam     Assessment & Plan:  Impression 1 type 2 diabetes good control. #2 hyperlipidemia status uncertain. #3 hypertension good control. Plan diet exercise discussed in encourage. Maintain same medications. Recheck in 4 months. Further recommendations based results. WSL

## 2013-07-11 DIAGNOSIS — N1831 Chronic kidney disease, stage 3a: Secondary | ICD-10-CM | POA: Insufficient documentation

## 2013-07-11 DIAGNOSIS — I10 Essential (primary) hypertension: Secondary | ICD-10-CM | POA: Insufficient documentation

## 2013-07-11 DIAGNOSIS — E785 Hyperlipidemia, unspecified: Secondary | ICD-10-CM | POA: Insufficient documentation

## 2013-07-11 DIAGNOSIS — E119 Type 2 diabetes mellitus without complications: Secondary | ICD-10-CM | POA: Insufficient documentation

## 2013-07-18 ENCOUNTER — Other Ambulatory Visit: Payer: Self-pay | Admitting: *Deleted

## 2013-07-18 MED ORDER — PRAVASTATIN SODIUM 40 MG PO TABS: 40.0000 mg | ORAL_TABLET | Freq: Every day | ORAL | Status: DC

## 2013-08-09 LAB — PSA: PSA: 0.53 ng/mL (ref <=4.00)

## 2013-08-09 LAB — LIPID PANEL
CHOL/HDL RATIO: 4.5 ratio
Cholesterol: 131 mg/dL (ref 0–200)
HDL: 29 mg/dL — AB (ref >39)
LDL CALC: 78 mg/dL (ref 0–99)
TRIGLYCERIDES: 121 mg/dL (ref <150)
VLDL: 24 mg/dL (ref 0–40)

## 2013-08-09 LAB — BASIC METABOLIC PANEL
BUN: 17 mg/dL (ref 6–23)
CHLORIDE: 102 meq/L (ref 96–112)
CO2: 27 mEq/L (ref 19–32)
CREATININE: 1.37 mg/dL — AB (ref 0.50–1.35)
Calcium: 9.6 mg/dL (ref 8.4–10.5)
Glucose, Bld: 104 mg/dL — ABNORMAL HIGH (ref 70–99)
POTASSIUM: 4.1 meq/L (ref 3.5–5.3)
SODIUM: 138 meq/L (ref 135–145)

## 2013-08-09 LAB — HEPATIC FUNCTION PANEL
ALBUMIN: 3.7 g/dL (ref 3.5–5.2)
ALT: 9 U/L (ref 0–53)
AST: 13 U/L (ref 0–37)
Alkaline Phosphatase: 80 U/L (ref 39–117)
Bilirubin, Direct: 0.1 mg/dL (ref 0.0–0.3)
Indirect Bilirubin: 0.3 mg/dL (ref 0.2–1.2)
TOTAL PROTEIN: 7.3 g/dL (ref 6.0–8.3)
Total Bilirubin: 0.4 mg/dL (ref 0.2–1.2)

## 2013-08-09 LAB — MICROALBUMIN, URINE: MICROALB UR: 0.5 mg/dL (ref 0.00–1.89)

## 2013-08-10 ENCOUNTER — Encounter: Payer: Self-pay | Admitting: Family Medicine

## 2013-09-29 DIAGNOSIS — Z0289 Encounter for other administrative examinations: Secondary | ICD-10-CM

## 2014-01-31 ENCOUNTER — Other Ambulatory Visit: Payer: Self-pay | Admitting: *Deleted

## 2014-01-31 MED ORDER — METOPROLOL TARTRATE 50 MG PO TABS: 50.0000 mg | ORAL_TABLET | Freq: Two times a day (BID) | ORAL | Status: DC

## 2014-01-31 MED ORDER — TRIAMTERENE-HCTZ 37.5-25 MG PO TABS: 1.0000 | ORAL_TABLET | Freq: Every day | ORAL | Status: DC

## 2014-01-31 MED ORDER — PRAVASTATIN SODIUM 40 MG PO TABS: 40.0000 mg | ORAL_TABLET | Freq: Every day | ORAL | Status: DC

## 2014-01-31 MED ORDER — METFORMIN HCL 500 MG PO TABS: 500.0000 mg | ORAL_TABLET | Freq: Every day | ORAL | Status: DC

## 2014-01-31 MED ORDER — DOXAZOSIN MESYLATE 4 MG PO TABS
4.0000 mg | ORAL_TABLET | Freq: Every day | ORAL | Status: DC
Start: 2014-01-31 — End: 2014-03-18

## 2014-01-31 MED ORDER — LISINOPRIL 5 MG PO TABS: 5.0000 mg | ORAL_TABLET | Freq: Every day | ORAL | Status: DC

## 2014-03-18 ENCOUNTER — Encounter: Payer: Self-pay | Admitting: Family Medicine

## 2014-03-18 ENCOUNTER — Ambulatory Visit (INDEPENDENT_AMBULATORY_CARE_PROVIDER_SITE_OTHER): Payer: Self-pay | Admitting: Family Medicine

## 2014-03-18 VITALS — BP 118/80 | Ht 70.0 in | Wt 180.0 lb

## 2014-03-18 DIAGNOSIS — Z79899 Other long term (current) drug therapy: Secondary | ICD-10-CM

## 2014-03-18 DIAGNOSIS — E785 Hyperlipidemia, unspecified: Secondary | ICD-10-CM

## 2014-03-18 DIAGNOSIS — Z23 Encounter for immunization: Secondary | ICD-10-CM

## 2014-03-18 DIAGNOSIS — E119 Type 2 diabetes mellitus without complications: Secondary | ICD-10-CM

## 2014-03-18 LAB — POCT GLYCOSYLATED HEMOGLOBIN (HGB A1C): HEMOGLOBIN A1C: 6

## 2014-03-18 MED ORDER — METFORMIN HCL 500 MG PO TABS: 500.0000 mg | ORAL_TABLET | Freq: Every day | ORAL | Status: DC

## 2014-03-18 MED ORDER — TRIAMTERENE-HCTZ 37.5-25 MG PO TABS: 1.0000 | ORAL_TABLET | Freq: Every day | ORAL | Status: DC

## 2014-03-18 MED ORDER — PRAVASTATIN SODIUM 40 MG PO TABS: 40.0000 mg | ORAL_TABLET | Freq: Every day | ORAL | Status: DC

## 2014-03-18 MED ORDER — DOXAZOSIN MESYLATE 4 MG PO TABS: 4.0000 mg | ORAL_TABLET | Freq: Every day | ORAL | Status: DC

## 2014-03-18 MED ORDER — METOPROLOL TARTRATE 50 MG PO TABS: 50.0000 mg | ORAL_TABLET | Freq: Two times a day (BID) | ORAL | Status: DC

## 2014-03-18 MED ORDER — LISINOPRIL 5 MG PO TABS: 5.0000 mg | ORAL_TABLET | Freq: Every day | ORAL | Status: DC

## 2014-03-18 NOTE — Progress Notes (Signed)
   Subjective:    Patient ID: Daryl Perkins, male    DOB: 1951-06-25, 63 y.o.   MRN: [id number]  HPI Patient is here today for medication management. Patient has no other concerns.  Patient on blood pressure medication. Claims compliance. No obvious side effects. Watch his salt intake.  Patient compliant diabetes medicine. Trying to watch sugar intake does not always 60. Not checking sugars at all. Next  Compliant with lipid medication. No obvious side effects. Mostly watching fat diet. Next  Walking and exercising regularly.  No preventive wellness exam for over a year  Results for orders placed in visit on 07/10/13  LIPID PANEL      Result Value Ref Range   Cholesterol 131  0 - 200 mg/dL   Triglycerides 121  <150 mg/dL   HDL 29 (*) >39 mg/dL   Total CHOL/HDL Ratio 4.5     VLDL 24  0 - 40 mg/dL   LDL Cholesterol 78  0 - 99 mg/dL  HEPATIC FUNCTION PANEL      Result Value Ref Range   Total Bilirubin 0.4  0.2 - 1.2 mg/dL   Bilirubin, Direct 0.1  0.0 - 0.3 mg/dL   Indirect Bilirubin 0.3  0.2 - 1.2 mg/dL   Alkaline Phosphatase 80  39 - 117 U/L   AST 13  0 - 37 U/L   ALT 9  0 - 53 U/L   Total Protein 7.3  6.0 - 8.3 g/dL   Albumin 3.7  3.5 - 5.2 g/dL  BASIC METABOLIC PANEL      Result Value Ref Range   Sodium 138  135 - 145 mEq/L   Potassium 4.1  3.5 - 5.3 mEq/L   Chloride 102  96 - 112 mEq/L   CO2 27  19 - 32 mEq/L   Glucose, Bld 104 (*) 70 - 99 mg/dL   BUN 17  6 - 23 mg/dL   Creat 1.37 (*) 0.50 - 1.35 mg/dL   Calcium 9.6  8.4 - 10.5 mg/dL  PSA      Result Value Ref Range   PSA 0.53  <=4.00 ng/mL  MICROALBUMIN, URINE      Result Value Ref Range   Microalb, Ur 0.50  0.00 - 1.89 mg/dL  POCT GLYCOSYLATED HEMOGLOBIN (HGB A1C)      Result Value Ref Range   Hemoglobin A1C 6.0       Review of Systems No chest pain headache and back pain no abdominal pain no change in bowel habits no blood in stool ROS otherwise negative    Objective:   Physical Exam  Alert HEENT  normal vital stable blood pressure good lungs clear heart regular in rhythm ankles without edema      Assessment & Plan:  Impression 1 type 2 diabetes.this Results for orders placed in visit on 03/18/14  POCT GLYCOSYLATED HEMOGLOBIN (HGB A1C)      Result Value Ref Range   Hemoglobin A1C 6.0     Control. #2 hypertension controlled #3 hyperlipidemia status uncertain. Plan prescribed glucometer start checking a least 3 per week. Diet exercise discussed. Flu shot today. Appropriate blood work. Check for wellness exam in several months with Dr. Nicki Reaper.

## 2014-06-16 ENCOUNTER — Encounter: Payer: Self-pay | Admitting: Family Medicine

## 2017-01-01 ENCOUNTER — Telehealth: Payer: Self-pay

## 2017-01-01 NOTE — Telephone Encounter (Signed)
Letter mailed to pt.  

## 2017-01-01 NOTE — Telephone Encounter (Signed)
RECALL FOR [facility]CS °

## 2017-02-07 ENCOUNTER — Emergency Department (HOSPITAL_COMMUNITY): Payer: Medicare Other

## 2017-02-07 ENCOUNTER — Emergency Department (HOSPITAL_COMMUNITY)
Admission: EM | Admit: 2017-02-07 | Discharge: 2017-02-08 | Disposition: A | Discharge status: Home / Self Care | Payer: Medicare Other | Attending: Emergency Medicine | Admitting: Emergency Medicine

## 2017-02-07 ENCOUNTER — Encounter (HOSPITAL_COMMUNITY): Payer: Self-pay | Admitting: Emergency Medicine

## 2017-02-07 DIAGNOSIS — R1110 Vomiting, unspecified: Secondary | ICD-10-CM | POA: Diagnosis not present

## 2017-02-07 DIAGNOSIS — E860 Dehydration: Secondary | ICD-10-CM | POA: Diagnosis not present

## 2017-02-07 DIAGNOSIS — R112 Nausea with vomiting, unspecified: Secondary | ICD-10-CM | POA: Diagnosis not present

## 2017-02-07 DIAGNOSIS — K529 Noninfective gastroenteritis and colitis, unspecified: Secondary | ICD-10-CM | POA: Insufficient documentation

## 2017-02-07 DIAGNOSIS — I10 Essential (primary) hypertension: Secondary | ICD-10-CM | POA: Insufficient documentation

## 2017-02-07 DIAGNOSIS — E119 Type 2 diabetes mellitus without complications: Secondary | ICD-10-CM | POA: Diagnosis not present

## 2017-02-07 DIAGNOSIS — K4090 Unilateral inguinal hernia, without obstruction or gangrene, not specified as recurrent: Secondary | ICD-10-CM | POA: Diagnosis not present

## 2017-02-07 LAB — URINALYSIS, ROUTINE W REFLEX MICROSCOPIC
Bilirubin Urine: NEGATIVE
Glucose, UA: 150 mg/dL — AB
HGB URINE DIPSTICK: NEGATIVE
Ketones, ur: 5 mg/dL — AB
Leukocytes, UA: NEGATIVE
Nitrite: NEGATIVE
Protein, ur: 100 mg/dL — AB
SPECIFIC GRAVITY, URINE: 1.019 (ref 1.005–1.030)
pH: 5 (ref 5.0–8.0)

## 2017-02-07 LAB — CBC
HEMATOCRIT: 38.8 % — AB (ref 39.0–52.0)
HEMOGLOBIN: 12.7 g/dL — AB (ref 13.0–17.0)
MCH: 28.5 pg (ref 26.0–34.0)
MCHC: 32.7 g/dL (ref 30.0–36.0)
MCV: 87.2 fL (ref 78.0–100.0)
Platelets: 210 10*3/uL (ref 150–400)
RBC: 4.45 MIL/uL (ref 4.22–5.81)
RDW: 14.8 % (ref 11.5–15.5)
WBC: 14.1 10*3/uL — ABNORMAL HIGH (ref 4.0–10.5)

## 2017-02-07 LAB — COMPREHENSIVE METABOLIC PANEL
ALT: 14 U/L — ABNORMAL LOW (ref 17–63)
ANION GAP: 10 (ref 5–15)
AST: 22 U/L (ref 15–41)
Albumin: 4.1 g/dL (ref 3.5–5.0)
Alkaline Phosphatase: 92 U/L (ref 38–126)
BUN: 20 mg/dL (ref 6–20)
CALCIUM: 9.5 mg/dL (ref 8.9–10.3)
CHLORIDE: 106 mmol/L (ref 101–111)
CO2: 27 mmol/L (ref 22–32)
Creatinine, Ser: 1.63 mg/dL — ABNORMAL HIGH (ref 0.61–1.24)
GFR calc non Af Amer: 43 mL/min — ABNORMAL LOW (ref >60)
GFR, EST AFRICAN AMERICAN: 50 mL/min — AB (ref >60)
Glucose, Bld: 194 mg/dL — ABNORMAL HIGH (ref 65–99)
POTASSIUM: 3.6 mmol/L (ref 3.5–5.1)
SODIUM: 143 mmol/L (ref 135–145)
Total Bilirubin: 0.7 mg/dL (ref 0.3–1.2)
Total Protein: 8.4 g/dL — ABNORMAL HIGH (ref 6.5–8.1)

## 2017-02-07 LAB — LIPASE, BLOOD: LIPASE: 29 U/L (ref 11–51)

## 2017-02-07 MED ORDER — IOPAMIDOL (ISOVUE-300) INJECTION 61%
100.0000 mL | Freq: Once | INTRAVENOUS | Status: AC | PRN
  Administered 2017-02-07: 100 mL via INTRAVENOUS

## 2017-02-07 MED ORDER — SODIUM CHLORIDE 0.9 % IV BOLUS (SEPSIS)
500.0000 mL | Freq: Once | INTRAVENOUS | Status: AC
  Administered 2017-02-07: 500 mL via INTRAVENOUS

## 2017-02-07 NOTE — ED Triage Notes (Signed)
Pt c/o sudden onset of vomiting at church tonight with dizziness and abd pain.

## 2017-02-08 NOTE — Discharge Instructions (Signed)
Clear liquids for the next 12 hours.  See Dr. Wolfgang Phoenix for recheck in 2 days.  Return here if symptoms persist past 24 hours.

## 2017-02-08 NOTE — ED Notes (Signed)
Pt's necklace and ring placed in locker with clothing.

## 2017-02-10 NOTE — ED Provider Notes (Signed)
[facility] DEPT Provider Note   CSN: [id number] Arrival date & time: 02/07/17  1851     History   Chief Complaint Chief Complaint  Patient presents with  . Emesis    HPI Daryl Perkins is a 66 y.o. male.  The history is provided by the patient. No language interpreter was used.  Emesis   This is a new problem. The problem occurs 5 to 10 times per day. The emesis has an appearance of stomach contents. There has been no fever. Pertinent negatives include no chills.    Past Medical History:  Diagnosis Date  . Diabetes mellitus without complication ([place])   . HOH (hard of hearing)   . Hypertension   . Poor dental hygiene     Patient Active Problem List   Diagnosis Date Noted  . Type II or unspecified type diabetes mellitus without mention of complication, not stated as uncontrolled 07/11/2013  . Essential hypertension, benign 07/11/2013  . Other and unspecified hyperlipidemia 07/11/2013    Past Surgical History:  Procedure Laterality Date  . [place]. lumbar disc  . INGUINAL HERNIA REPAIR Right 09/09/2012   Procedure: HERNIA REPAIR INGUINAL ADULT;  Surgeon: Jamesetta So, MD;  Location: [facility];  Service: General;  Laterality: Right;  . INSERTION OF MESH Right 09/09/2012   Procedure: INSERTION OF MESH;  Surgeon: Jamesetta So, MD;  Location: [facility];  Service: General;  Laterality: Right;       Home Medications    Prior to Admission medications   Medication Sig Start Date End Date Taking? Authorizing Provider  acetaminophen (TYLENOL) 500 MG tablet Take 500-1,000 mg by mouth every 6 (six) hours as needed for moderate pain.   Yes [provider]    Family History No family history on file.  Social History Social History  Substance Use Topics  . Smoking status: Never Smoker  . Smokeless tobacco: Never Used  . Alcohol use No     Allergies   Patient has no known allergies.   Review of Systems Review of Systems    Constitutional: Negative for chills.  Gastrointestinal: Positive for vomiting.  All other systems reviewed and are negative.    Physical Exam Updated Vital Signs BP (!) 199/92   Pulse 65   Temp 98.1 F (36.7 C)   Resp 18   Ht 5\' 11"  (1.803 m)   Wt 90.7 kg (200 lb)   SpO2 96%   BMI 27.89 kg/m   Physical Exam  Constitutional: He is oriented to person, place, and time. He appears well-developed and well-nourished.  HENT:  Head: Normocephalic.  Right Ear: External ear normal.  Left Ear: External ear normal.  Nose: Nose normal.  Mouth/Throat: Oropharynx is clear and moist.  Eyes: Pupils are equal, round, and reactive to light. EOM are normal.  Neck: Normal range of motion.  Cardiovascular: Normal rate and regular rhythm.   Pulmonary/Chest: Effort normal and breath sounds normal.  Abdominal: Soft. He exhibits no distension. There is tenderness.  Diffusely tender abdomen    Musculoskeletal: Normal range of motion.  Neurological: He is alert and oriented to person, place, and time.  Psychiatric: He has a normal mood and affect.  Nursing note and vitals reviewed.    ED Treatments / Results  Labs (all labs ordered are listed, but only abnormal results are displayed) Labs Reviewed  COMPREHENSIVE METABOLIC PANEL - Abnormal; Notable for the following:       Result  Value   Glucose, Bld 194 (*)    Creatinine, Ser 1.63 (*)    Total Protein 8.4 (*)    ALT 14 (*)    GFR calc non Af Amer 43 (*)    GFR calc Af Amer 50 (*)    All other components within normal limits  CBC - Abnormal; Notable for the following:    WBC 14.1 (*)    Hemoglobin 12.7 (*)    HCT 38.8 (*)    All other components within normal limits  URINALYSIS, ROUTINE W REFLEX MICROSCOPIC - Abnormal; Notable for the following:    Glucose, UA 150 (*)    Ketones, ur 5 (*)    Protein, ur 100 (*)    All other components within normal limits  LIPASE, BLOOD    EKG  EKG Interpretation None       Radiology No  results found.  Procedures Procedures (including critical care time)  Medications Ordered in ED Medications  sodium chloride 0.9 % bolus 500 mL (0 mLs Intravenous Stopped 02/07/17 2329)  iopamidol (ISOVUE-300) 61 % injection 100 mL (100 mLs Intravenous Contrast Given 02/07/17 2305)     Initial Impression / Assessment and Plan / ED Course  I have reviewed the triage vital signs and the nursing notes.  Pertinent labs & imaging results that were available during my care of the patient were reviewed by me and considered in my medical decision making (see chart for details).     Pt feels better after fluids,  Blood pressure decreased.  Pt has not taken his medication.  Pt able to drink  Fluids.  Pt counseled on htn and on probable viral gastroenteritis.  Pt to recheck in 24 hours if not resolved,  Return if worse.  Final Clinical Impressions(s) / ED Diagnoses   Final diagnoses:  Gastroenteritis  Non-intractable vomiting, presence of nausea not specified, unspecified vomiting type  Dehydration    New Prescriptions Discharge Medication List as of 02/08/2017 12:14 AM    An After Visit Summary was printed and given to the patient.   Fransico Meadow, [place] 02/10/17 1007    Daleen Bo, MD 02/10/17 1114

## 2017-03-02 ENCOUNTER — Telehealth: Payer: Self-pay | Admitting: Family Medicine

## 2017-03-02 DIAGNOSIS — I10 Essential (primary) hypertension: Secondary | ICD-10-CM

## 2017-03-02 DIAGNOSIS — Z79899 Other long term (current) drug therapy: Secondary | ICD-10-CM

## 2017-03-02 DIAGNOSIS — E119 Type 2 diabetes mellitus without complications: Secondary | ICD-10-CM

## 2017-03-02 DIAGNOSIS — Z125 Encounter for screening for malignant neoplasm of prostate: Secondary | ICD-10-CM

## 2017-03-02 DIAGNOSIS — D72829 Elevated white blood cell count, unspecified: Secondary | ICD-10-CM

## 2017-03-02 DIAGNOSIS — E785 Hyperlipidemia, unspecified: Secondary | ICD-10-CM

## 2017-03-02 NOTE — Telephone Encounter (Signed)
Pt is needing lab orders sent over for a wellness visit with Dr. Nicki Reaper on 03/27/17.

## 2017-03-02 NOTE — Telephone Encounter (Signed)
Patient not seen since 2015- no blood work from [place] since 2015

## 2017-03-06 NOTE — Telephone Encounter (Signed)
Tried to call no answer

## 2017-03-06 NOTE — Telephone Encounter (Signed)
CBC, metabolic 7, lipid, liver, PSA, A1c-diabetes, renal insufficiency, leukocytosis

## 2017-03-08 NOTE — Telephone Encounter (Signed)
Tried to call no answer

## 2017-03-09 NOTE — Telephone Encounter (Signed)
Left message to return call 

## 2017-03-09 NOTE — Telephone Encounter (Signed)
Pt.notified

## 2017-03-27 ENCOUNTER — Encounter: Payer: Self-pay | Admitting: Family Medicine

## 2017-04-17 ENCOUNTER — Ambulatory Visit (INDEPENDENT_AMBULATORY_CARE_PROVIDER_SITE_OTHER): Payer: Medicare Other | Admitting: Family Medicine

## 2017-04-17 ENCOUNTER — Encounter: Payer: Self-pay | Admitting: Family Medicine

## 2017-04-17 VITALS — BP 185/98 | Ht 70.0 in | Wt 175.0 lb

## 2017-04-17 DIAGNOSIS — Z23 Encounter for immunization: Secondary | ICD-10-CM | POA: Diagnosis not present

## 2017-04-17 DIAGNOSIS — I10 Essential (primary) hypertension: Secondary | ICD-10-CM | POA: Diagnosis not present

## 2017-04-17 DIAGNOSIS — Z0000 Encounter for general adult medical examination without abnormal findings: Secondary | ICD-10-CM | POA: Diagnosis not present

## 2017-04-17 MED ORDER — AMLODIPINE BESYLATE 5 MG PO TABS: 5.0000 mg | ORAL_TABLET | Freq: Every day | ORAL | 5 refills | Status: DC

## 2017-04-17 NOTE — Progress Notes (Signed)
   Subjective:    Patient ID: Daryl Perkins, male    DOB: Dec 17, 1950, 66 y.o.   MRN: [id number]  HPI  The patient comes in today for a wellness visit.    A review of their health history was completed.  A review of medications was also completed.  Any needed refills; No He is not on any medications he tell me he takes 2 teaspoons of Mustard and 1 teaspoon of vinegar as needed for his HTN.  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   Additional concerns: None Patient bp today 250/130 today, states he thinks he has a headache.He says he has migraines  And he walked here today. Review of Systems  Constitutional: Negative for activity change, appetite change and fever.  HENT: Negative for congestion and rhinorrhea.   Eyes: Negative for discharge.  Respiratory: Negative for cough and wheezing.   Cardiovascular: Negative for chest pain.  Gastrointestinal: Negative for abdominal pain, blood in stool and vomiting.  Genitourinary: Negative for difficulty urinating and frequency.  Musculoskeletal: Negative for neck pain.  Skin: Negative for rash.  Allergic/Immunologic: Negative for environmental allergies and food allergies.  Neurological: Negative for weakness and headaches.  Psychiatric/Behavioral: Negative for agitation.        Objective:   Physical Exam  Constitutional: He appears well-developed and well-nourished.  HENT:  Head: Normocephalic and atraumatic.  Right Ear: External ear normal.  Left Ear: External ear normal.  Nose: Nose normal.  Mouth/Throat: Oropharynx is clear and moist.  Eyes: Pupils are equal, round, and reactive to light. EOM are normal.  Neck: Normal range of motion. Neck supple. No thyromegaly present.  Cardiovascular: Normal rate, regular rhythm and normal heart sounds.   No murmur heard. Pulmonary/Chest:  Effort normal and breath sounds normal. No respiratory distress. He has no wheezes.  Abdominal: Soft. Bowel sounds are normal. He exhibits no distension and no mass. There is no tenderness.  Genitourinary: Penis normal.  Musculoskeletal: Normal range of motion. He exhibits no edema.  Lymphadenopathy:    He has no cervical adenopathy.  Neurological: He is alert. He exhibits normal muscle tone.  Skin: Skin is warm and dry. No erythema.  Psychiatric: He has a normal mood and affect. His behavior is normal. Judgment normal.    Prostate exam deferred await lab work      Assessment & Plan:  Adult wellness-complete.wellness physical was conducted today. Importance of diet and exercise were discussed in detail. In addition to this a discussion regarding safety was also covered. We also reviewed over immunizations and gave recommendations regarding current immunization needed for age. In addition to this additional areas were also touched on including: Preventative health exams needed: Colonoscopy patient is due colonoscopy unlikely he will get it done we will discuss it more on his follow-up visit  Patient was advised yearly wellness exam  Blood pressure significantly elevated start amlodipine patient follow-up in approximately 14 days to recheck blood pressure

## 2017-04-17 NOTE — Patient Instructions (Signed)
DASH Eating Plan DASH stands for "Dietary Approaches to Stop Hypertension." The DASH eating plan is a healthy eating plan that has been shown to reduce high blood pressure (hypertension). It may also reduce your risk for type 2 diabetes, heart disease, and stroke. The DASH eating plan may also help with weight loss. What are tips for following this plan? General guidelines  Avoid eating more than 2,300 mg (milligrams) of salt (sodium) a day. If you have hypertension, you may need to reduce your sodium intake to 1,500 mg a day.  Limit alcohol intake to no more than 1 drink a day for nonpregnant women and 2 drinks a day for men. One drink equals 12 oz of beer, 5 oz of wine, or 1 oz of hard liquor.  Work with your health care provider to maintain a healthy body weight or to lose weight. Ask what an ideal weight is for you.  Get at least 30 minutes of exercise that causes your heart to beat faster (aerobic exercise) most days of the week. Activities may include walking, swimming, or biking.  Work with your health care provider or diet and nutrition specialist (dietitian) to adjust your eating plan to your individual calorie needs. Reading food labels  Check food labels for the amount of sodium per serving. Choose foods with less than 5 percent of the Daily Value of sodium. Generally, foods with less than 300 mg of sodium per serving fit into this eating plan.  To find whole grains, look for the word "whole" as the first word in the ingredient list. Shopping  Buy products labeled as "low-sodium" or "no salt added."  Buy fresh foods. Avoid canned foods and premade or frozen meals. Cooking  Avoid adding salt when cooking. Use salt-free seasonings or herbs instead of table salt or sea salt. Check with your health care provider or pharmacist before using salt substitutes.  Do not fry foods. Cook foods using healthy methods such as baking, boiling, grilling, and broiling instead.  Cook with  heart-healthy oils, such as olive, canola, soybean, or sunflower oil. Meal planning   Eat a balanced diet that includes: ? 5 or more servings of fruits and vegetables each day. At each meal, try to fill half of your plate with fruits and vegetables. ? Up to 6-8 servings of whole grains each day. ? Less than 6 oz of lean meat, poultry, or fish each day. A 3-oz serving of meat is about the same size as a deck of cards. One egg equals 1 oz. ? 2 servings of low-fat dairy each day. ? A serving of nuts, seeds, or beans 5 times each week. ? Heart-healthy fats. Healthy fats called Omega-3 fatty acids are found in foods such as flaxseeds and coldwater fish, like sardines, salmon, and mackerel.  Limit how much you eat of the following: ? Canned or prepackaged foods. ? Food that is high in trans fat, such as fried foods. ? Food that is high in saturated fat, such as fatty meat. ? Sweets, desserts, sugary drinks, and other foods with added sugar. ? Full-fat dairy products.  Do not salt foods before eating.  Try to eat at least 2 vegetarian meals each week.  Eat more home-cooked food and less restaurant, buffet, and fast food.  When eating at a restaurant, ask that your food be prepared with less salt or no salt, if possible. What foods are recommended? The items listed may not be a complete list. Talk with your dietitian about what   dietary choices are best for you. Grains Whole-grain or whole-wheat bread. Whole-grain or whole-wheat pasta. Brown rice. Oatmeal. Quinoa. Bulgur. Whole-grain and low-sodium cereals. Pita bread. Low-fat, low-sodium crackers. Whole-wheat flour tortillas. Vegetables Fresh or frozen vegetables (raw, steamed, roasted, or grilled). Low-sodium or reduced-sodium tomato and vegetable juice. Low-sodium or reduced-sodium tomato sauce and tomato paste. Low-sodium or reduced-sodium canned vegetables. Fruits All fresh, dried, or frozen fruit. Canned fruit in natural juice (without  added sugar). Meat and other protein foods Skinless chicken or turkey. Ground chicken or turkey. Pork with fat trimmed off. Fish and seafood. Egg whites. Dried beans, peas, or lentils. Unsalted nuts, nut butters, and seeds. Unsalted canned beans. Lean cuts of beef with fat trimmed off. Low-sodium, lean deli meat. Dairy Low-fat (1%) or fat-free (skim) milk. Fat-free, low-fat, or reduced-fat cheeses. Nonfat, low-sodium ricotta or cottage cheese. Low-fat or nonfat yogurt. Low-fat, low-sodium cheese. Fats and oils Soft margarine without trans fats. Vegetable oil. Low-fat, reduced-fat, or light mayonnaise and salad dressings (reduced-sodium). Canola, safflower, olive, soybean, and sunflower oils. Avocado. Seasoning and other foods Herbs. Spices. Seasoning mixes without salt. Unsalted popcorn and pretzels. Fat-free sweets. What foods are not recommended? The items listed may not be a complete list. Talk with your dietitian about what dietary choices are best for you. Grains Baked goods made with fat, such as croissants, muffins, or some breads. Dry pasta or rice meal packs. Vegetables Creamed or fried vegetables. Vegetables in a cheese sauce. Regular canned vegetables (not low-sodium or reduced-sodium). Regular canned tomato sauce and paste (not low-sodium or reduced-sodium). Regular tomato and vegetable juice (not low-sodium or reduced-sodium). Pickles. Olives. Fruits Canned fruit in a light or heavy syrup. Fried fruit. Fruit in cream or butter sauce. Meat and other protein foods Fatty cuts of meat. Ribs. Fried meat. Bacon. Sausage. Bologna and other processed lunch meats. Salami. Fatback. Hotdogs. Bratwurst. Salted nuts and seeds. Canned beans with added salt. Canned or smoked fish. Whole eggs or egg yolks. Chicken or turkey with skin. Dairy Whole or 2% milk, cream, and half-and-half. Whole or full-fat cream cheese. Whole-fat or sweetened yogurt. Full-fat cheese. Nondairy creamers. Whipped toppings.  Processed cheese and cheese spreads. Fats and oils Butter. Stick margarine. Lard. Shortening. Ghee. Bacon fat. Tropical oils, such as coconut, palm kernel, or palm oil. Seasoning and other foods Salted popcorn and pretzels. Onion salt, garlic salt, seasoned salt, table salt, and sea salt. Worcestershire sauce. Tartar sauce. Barbecue sauce. Teriyaki sauce. Soy sauce, including reduced-sodium. Steak sauce. Canned and packaged gravies. Fish sauce. Oyster sauce. Cocktail sauce. Horseradish that you find on the shelf. Ketchup. Mustard. Meat flavorings and tenderizers. Bouillon cubes. Hot sauce and Tabasco sauce. Premade or packaged marinades. Premade or packaged taco seasonings. Relishes. Regular salad dressings. Where to find more information:  National Heart, Lung, and Blood Institute: www.nhlbi.nih.gov  American Heart Association: www.heart.org Summary  The DASH eating plan is a healthy eating plan that has been shown to reduce high blood pressure (hypertension). It may also reduce your risk for type 2 diabetes, heart disease, and stroke.  With the DASH eating plan, you should limit salt (sodium) intake to 2,300 mg a day. If you have hypertension, you may need to reduce your sodium intake to 1,500 mg a day.  When on the DASH eating plan, aim to eat more fresh fruits and vegetables, whole grains, lean proteins, low-fat dairy, and heart-healthy fats.  Work with your health care provider or diet and nutrition specialist (dietitian) to adjust your eating plan to your individual   calorie needs. This information is not intended to replace advice given to you by your health care provider. Make sure you discuss any questions you have with your health care provider. Document Released: 06/08/2011 Document Revised: 06/12/2016 Document Reviewed: 06/12/2016 Elsevier Interactive Patient Education  2017 [employer]  

## 2017-04-26 ENCOUNTER — Encounter: Payer: Self-pay | Admitting: Family Medicine

## 2017-04-26 ENCOUNTER — Ambulatory Visit (INDEPENDENT_AMBULATORY_CARE_PROVIDER_SITE_OTHER): Payer: Medicare Other | Admitting: Family Medicine

## 2017-04-26 VITALS — BP 190/90 | Ht 70.0 in | Wt 180.0 lb

## 2017-04-26 DIAGNOSIS — R739 Hyperglycemia, unspecified: Secondary | ICD-10-CM | POA: Diagnosis not present

## 2017-04-26 DIAGNOSIS — Z125 Encounter for screening for malignant neoplasm of prostate: Secondary | ICD-10-CM | POA: Diagnosis not present

## 2017-04-26 DIAGNOSIS — I10 Essential (primary) hypertension: Secondary | ICD-10-CM | POA: Diagnosis not present

## 2017-04-26 MED ORDER — DOXAZOSIN MESYLATE 2 MG PO TABS: 2.0000 mg | ORAL_TABLET | Freq: Every day | ORAL | 5 refills | Status: DC

## 2017-04-26 MED ORDER — AMLODIPINE BESYLATE 10 MG PO TABS: 10.0000 mg | ORAL_TABLET | Freq: Every day | ORAL | 5 refills | Status: DC

## 2017-04-26 NOTE — Progress Notes (Signed)
   Subjective:    Patient ID: Daryl Perkins, male    DOB: 06/12/1951, 65 y.o.   MRN: [id number]  Hypertension  This is a chronic problem. The current episode started more than 1 year ago. Pertinent negatives include no chest pain, headaches or shortness of breath.  He relates compliance with his medicine Tries to watch his diet but he is at the mercy of what his family feeds him because he does not prepare his own food he admits that he had several hot dogs today  Patient states no concerns this visit.  Review of Systems  Constitutional: Negative for activity change, fatigue and fever.  Respiratory: Negative for cough and shortness of breath.   Cardiovascular: Negative for chest pain and leg swelling.  Neurological: Negative for headaches.  He denies any chest pressure or tightness pain headaches     Objective:   Physical Exam  Constitutional: He appears well-nourished. No distress.  Cardiovascular: Normal rate, regular rhythm and normal heart sounds.   No murmur heard. Pulmonary/Chest: Effort normal and breath sounds normal. No respiratory distress.  Musculoskeletal: He exhibits no edema.  Lymphadenopathy:    He has no cervical adenopathy.  Neurological: He is alert.  Psychiatric: His behavior is normal.  Vitals reviewed.         Assessment & Plan:  HTN subpar control increased dose of amlodipine new dose 10 mg also start Cardura 2 mg nightly follow-up within 6 weeks  I have already talked with adult services to see if they can evaluate patient's home situation and make sure he is getting his needs met I have given the phone number to adult services to the patient so he can call to see if they can help him with transportation 

## 2017-04-26 NOTE — Progress Notes (Signed)
Dr Nicki Reaper spoke with adult protective services-04/26/17

## 2017-04-27 LAB — BASIC METABOLIC PANEL
BUN / CREAT RATIO: 10 (ref 10–24)
BUN: 13 mg/dL (ref 8–27)
CHLORIDE: 99 mmol/L (ref 96–106)
CO2: 27 mmol/L (ref 20–29)
Calcium: 9.5 mg/dL (ref 8.6–10.2)
Creatinine, Ser: 1.28 mg/dL — ABNORMAL HIGH (ref 0.76–1.27)
GFR, EST AFRICAN AMERICAN: 68 mL/min/{1.73_m2} (ref >59)
GFR, EST NON AFRICAN AMERICAN: 59 mL/min/{1.73_m2} — AB (ref >59)
Glucose: 123 mg/dL — ABNORMAL HIGH (ref 65–99)
POTASSIUM: 3.8 mmol/L (ref 3.5–5.2)
SODIUM: 140 mmol/L (ref 134–144)

## 2017-04-27 LAB — HEMOGLOBIN A1C
Est. average glucose Bld gHb Est-mCnc: 120 mg/dL
Hgb A1c MFr Bld: 5.8 % — ABNORMAL HIGH (ref 4.8–5.6)

## 2017-04-27 LAB — HEPATIC FUNCTION PANEL
ALK PHOS: 104 IU/L (ref 39–117)
ALT: 16 IU/L (ref 0–44)
AST: 24 IU/L (ref 0–40)
Albumin: 4.2 g/dL (ref 3.6–4.8)
BILIRUBIN, DIRECT: 0.08 mg/dL (ref 0.00–0.40)
Bilirubin Total: 0.3 mg/dL (ref 0.0–1.2)
Total Protein: 7.7 g/dL (ref 6.0–8.5)

## 2017-04-27 LAB — PSA: PROSTATE SPECIFIC AG, SERUM: 0.7 ng/mL (ref 0.0–4.0)

## 2017-04-28 ENCOUNTER — Encounter: Payer: Self-pay | Admitting: Family Medicine

## 2017-06-07 ENCOUNTER — Ambulatory Visit: Payer: Medicare Other | Admitting: Family Medicine

## 2017-06-21 ENCOUNTER — Encounter: Payer: Self-pay | Admitting: Family Medicine

## 2017-10-20 ENCOUNTER — Other Ambulatory Visit: Payer: Self-pay | Admitting: Family Medicine

## 2017-10-22 NOTE — Telephone Encounter (Signed)
Call ans acxhwed f u visit with dr Nicki Reaper, one mo worth only

## 2017-10-22 NOTE — Telephone Encounter (Signed)
Left message to return call. Also to verify rx. Chart has pt on 10mg .

## 2017-10-23 NOTE — Telephone Encounter (Signed)
I called no answer

## 2017-10-25 NOTE — Telephone Encounter (Signed)
I called and no answer.

## 2017-10-26 NOTE — Telephone Encounter (Signed)
Tried calling pt again and no answer.mailed a green card asking him to call.

## 2018-05-29 ENCOUNTER — Encounter: Payer: Self-pay | Admitting: Family Medicine

## 2018-05-29 ENCOUNTER — Ambulatory Visit (INDEPENDENT_AMBULATORY_CARE_PROVIDER_SITE_OTHER): Payer: Medicare Other | Admitting: Family Medicine

## 2018-05-29 VITALS — BP 180/106 | Ht 70.0 in | Wt 185.0 lb

## 2018-05-29 DIAGNOSIS — IMO0001 Reserved for inherently not codable concepts without codable children: Secondary | ICD-10-CM

## 2018-05-29 DIAGNOSIS — Z0000 Encounter for general adult medical examination without abnormal findings: Secondary | ICD-10-CM

## 2018-05-29 DIAGNOSIS — Z23 Encounter for immunization: Secondary | ICD-10-CM | POA: Diagnosis not present

## 2018-05-29 DIAGNOSIS — Z1211 Encounter for screening for malignant neoplasm of colon: Secondary | ICD-10-CM | POA: Diagnosis not present

## 2018-05-29 DIAGNOSIS — E1122 Type 2 diabetes mellitus with diabetic chronic kidney disease: Secondary | ICD-10-CM | POA: Diagnosis not present

## 2018-05-29 DIAGNOSIS — Z125 Encounter for screening for malignant neoplasm of prostate: Secondary | ICD-10-CM

## 2018-05-29 DIAGNOSIS — E782 Mixed hyperlipidemia: Secondary | ICD-10-CM

## 2018-05-29 DIAGNOSIS — I10 Essential (primary) hypertension: Secondary | ICD-10-CM

## 2018-05-29 DIAGNOSIS — Z79899 Other long term (current) drug therapy: Secondary | ICD-10-CM | POA: Diagnosis not present

## 2018-05-29 DIAGNOSIS — I129 Hypertensive chronic kidney disease with stage 1 through stage 4 chronic kidney disease, or unspecified chronic kidney disease: Secondary | ICD-10-CM

## 2018-05-29 MED ORDER — AMLODIPINE BESYLATE 10 MG PO TABS: 10.0000 mg | ORAL_TABLET | Freq: Every day | ORAL | 5 refills | Status: DC

## 2018-05-29 MED ORDER — DOXAZOSIN MESYLATE 2 MG PO TABS: 2.0000 mg | ORAL_TABLET | Freq: Every day | ORAL | 5 refills | Status: DC

## 2018-05-29 NOTE — Progress Notes (Signed)
Subjective:    Patient ID: Daryl Perkins, male    DOB: 1950/07/22, 67 y.o.   MRN: [id number]  HPI  AWV- Annual Wellness Visit  The patient was seen for their annual wellness visit. The patient's past medical history, surgical history, and family history were reviewed. Pertinent vaccines were reviewed ( tetanus, pneumonia, shingles, flu) The patient's medication list was reviewed and updated.  The height and weight were entered.  BMI recorded in electronic record elsewhere  Cognitive screening was completed. Outcome of Mini - BJS:EGBTDV   Falls /depression screening electronically recorded within record elsewhere  Current tobacco usage: No (All patients who use tobacco were given written and verbal information on quitting)  Recent listing of emergency department/hospitalizations over the past year were reviewed.  current specialist the patient sees on a regular basis: No  Medicare annual wellness visit patient questionnaire was reviewed.  A written screening schedule for the patient for the next 5-10 years was given. Appropriate discussion of followup regarding next visit was discussed.  Patient states he ran out of his medication and he has been taking vinegar and mustard. Patient for blood pressure check up.  The patient does have hypertension.  The patient is on medication.  Patient relates non compliance with meds. Todays BP reviewed with the patient. Patient denies issues with medication just he ran out. Patient relates reasonable diet. Patient tries to minimize salt. Patient aware of BP goals.   Review of Systems  Constitutional: Negative for diaphoresis and fatigue.  HENT: Negative for congestion and rhinorrhea.   Respiratory: Negative for cough and shortness of breath.   Cardiovascular: Negative for chest pain and leg swelling.  Gastrointestinal: Negative for abdominal pain and diarrhea.  Skin: Negative for color change and rash.  Neurological: Negative for  dizziness and headaches.  Psychiatric/Behavioral: Negative for behavioral problems and confusion.       Objective:   Physical Exam  Constitutional: He appears well-nourished. No distress.  HENT:  Head: Normocephalic and atraumatic.  Eyes: Right eye exhibits no discharge. Left eye exhibits no discharge.  Neck: No tracheal deviation present.  Cardiovascular: Normal rate, regular rhythm and normal heart sounds.  No murmur heard. Pulmonary/Chest: Effort normal and breath sounds normal. No respiratory distress.  Musculoskeletal: He exhibits no edema.  Lymphadenopathy:    He has no cervical adenopathy.  Neurological: He is alert. Coordination normal.  Skin: Skin is warm and dry.  Psychiatric: He has a normal mood and affect. His behavior is normal.  Vitals reviewed.  Blood pressure very elevated       Assessment & Plan:  Adult wellness-complete.wellness physical was conducted today. Importance of diet and exercise were discussed in detail.  In addition to this a discussion regarding safety was also covered. We also reviewed over immunizations and gave recommendations regarding current immunization needed for age.  In addition to this additional areas were also touched on including: Preventative health exams needed:  Colonoscopy colonoscopy referral  Patient was advised yearly wellness exam  HTN- Patient was seen today as part of a visit regarding hypertension. The importance of healthy diet and regular physical activity was discussed. The importance of compliance with medications discussed.  Ideal goal is to keep blood pressure low elevated levels certainly below 761/60 when possible.  The patient was counseled that keeping blood pressure under control lessen his risk of complications.  The importance of regular follow-ups was discussed with the patient.  Low-salt diet such as DASH recommended.  Regular physical activity was  recommended as well.  Patient was advised to keep  regular follow-ups. Subpar control blood pressure medications reinitiated follow-up in several weeks

## 2018-05-30 LAB — LIPID PANEL
CHOLESTEROL TOTAL: 225 mg/dL — AB (ref 100–199)
Chol/HDL Ratio: 5 ratio (ref 0.0–5.0)
HDL: 45 mg/dL (ref >39)
LDL Calculated: 166 mg/dL — ABNORMAL HIGH (ref 0–99)
Triglycerides: 69 mg/dL (ref 0–149)
VLDL CHOLESTEROL CAL: 14 mg/dL (ref 5–40)

## 2018-05-30 LAB — BASIC METABOLIC PANEL
BUN/Creatinine Ratio: 11 (ref 10–24)
BUN: 16 mg/dL (ref 8–27)
CALCIUM: 9.4 mg/dL (ref 8.6–10.2)
CO2: 27 mmol/L (ref 20–29)
Chloride: 98 mmol/L (ref 96–106)
Creatinine, Ser: 1.43 mg/dL — ABNORMAL HIGH (ref 0.76–1.27)
GFR calc Af Amer: 59 mL/min/{1.73_m2} — ABNORMAL LOW (ref >59)
GFR calc non Af Amer: 51 mL/min/{1.73_m2} — ABNORMAL LOW (ref >59)
GLUCOSE: 76 mg/dL (ref 65–99)
Potassium: 4 mmol/L (ref 3.5–5.2)
Sodium: 138 mmol/L (ref 134–144)

## 2018-05-30 LAB — HEMOGLOBIN A1C
Est. average glucose Bld gHb Est-mCnc: 128 mg/dL
Hgb A1c MFr Bld: 6.1 % — ABNORMAL HIGH (ref 4.8–5.6)

## 2018-05-30 LAB — HEPATIC FUNCTION PANEL
ALBUMIN: 4 g/dL (ref 3.6–4.8)
ALK PHOS: 103 IU/L (ref 39–117)
ALT: 10 IU/L (ref 0–44)
AST: 14 IU/L (ref 0–40)
BILIRUBIN, DIRECT: 0.12 mg/dL (ref 0.00–0.40)
Bilirubin Total: 0.5 mg/dL (ref 0.0–1.2)
Total Protein: 7.5 g/dL (ref 6.0–8.5)

## 2018-05-30 LAB — PSA: Prostate Specific Ag, Serum: 0.8 ng/mL (ref 0.0–4.0)

## 2018-05-31 ENCOUNTER — Encounter: Payer: Self-pay | Admitting: Family Medicine

## 2018-06-05 ENCOUNTER — Encounter: Payer: Self-pay | Admitting: Family Medicine

## 2018-06-18 ENCOUNTER — Encounter: Payer: Self-pay | Admitting: Internal Medicine

## 2018-06-24 ENCOUNTER — Ambulatory Visit (INDEPENDENT_AMBULATORY_CARE_PROVIDER_SITE_OTHER): Payer: Medicare Other | Admitting: Family Medicine

## 2018-06-24 ENCOUNTER — Encounter: Payer: Self-pay | Admitting: Family Medicine

## 2018-06-24 VITALS — BP 182/104 | Ht 70.0 in | Wt 192.0 lb

## 2018-06-24 DIAGNOSIS — M21612 Bunion of left foot: Secondary | ICD-10-CM | POA: Insufficient documentation

## 2018-06-24 DIAGNOSIS — M2042 Other hammer toe(s) (acquired), left foot: Secondary | ICD-10-CM | POA: Insufficient documentation

## 2018-06-24 DIAGNOSIS — I10 Essential (primary) hypertension: Secondary | ICD-10-CM | POA: Diagnosis not present

## 2018-06-24 MED ORDER — DOXAZOSIN MESYLATE 4 MG PO TABS: 4.0000 mg | ORAL_TABLET | Freq: Every day | ORAL | 5 refills | Status: DC

## 2018-06-24 NOTE — Progress Notes (Signed)
   Subjective:    Patient ID: Daryl Perkins, male    DOB: [birth year], 67 y.o.   MRN: [id number]  Hypertension  This is a chronic problem. Pertinent negatives include no chest pain, headaches or shortness of breath. Treatments tried: amlodipine, doxazosin. There are no compliance problems (takes meds every day, walks every day, ).    Pt states no concerns today.  Very nice gentleman Taking his blood pressure medicines Has hammertoe bunion on left foot which bothers him some  No ulcers No bleeding Also will be getting colonoscopy coming up in early 2020  Review of Systems  Constitutional: Negative for diaphoresis and fatigue.  HENT: Negative for congestion and rhinorrhea.   Respiratory: Negative for cough and shortness of breath.   Cardiovascular: Negative for chest pain and leg swelling.  Gastrointestinal: Negative for abdominal pain and diarrhea.  Skin: Negative for color change and rash.  Neurological: Negative for dizziness and headaches.  Psychiatric/Behavioral: Negative for behavioral problems and confusion.       Objective:   Physical Exam Vitals signs reviewed.  Constitutional:      General: He is not in acute distress. HENT:     Head: Normocephalic and atraumatic.  Eyes:     General:        Right eye: No discharge.        Left eye: No discharge.  Neck:     Trachea: No tracheal deviation.  Cardiovascular:     Rate and Rhythm: Normal rate and regular rhythm.     Heart sounds: Normal heart sounds. No murmur.  Pulmonary:     Effort: Pulmonary effort is normal. No respiratory distress.     Breath sounds: Normal breath sounds.  Lymphadenopathy:     Cervical: No cervical adenopathy.  Skin:    General: Skin is warm and dry.  Neurological:     Mental Status: He is alert.     Coordination: Coordination normal.  Psychiatric:        Behavior: Behavior normal.           Assessment & Plan:  Blood pressure subpar control Increase Cardura to 4 mg Follow-up  office visit in 4 weeks may need to continually adjust medicines watch diet stay active  Bunion left foot along with hammertoe referral to podiatry

## 2018-07-05 ENCOUNTER — Encounter: Payer: Self-pay | Admitting: Family Medicine

## 2018-07-19 ENCOUNTER — Ambulatory Visit (INDEPENDENT_AMBULATORY_CARE_PROVIDER_SITE_OTHER): Payer: Medicare Other | Admitting: Podiatry

## 2018-07-19 DIAGNOSIS — Z5329 Procedure and treatment not carried out because of patient's decision for other reasons: Secondary | ICD-10-CM

## 2018-07-23 ENCOUNTER — Ambulatory Visit: Payer: Medicare Other | Admitting: Family Medicine

## 2018-07-31 ENCOUNTER — Telehealth: Payer: Self-pay

## 2018-07-31 NOTE — Telephone Encounter (Signed)
Patient is currently at the soup kitchen and bp elevated today at 255/117,he is supposed to be on Amlodipine 10 mg per day and Doxazosin 4 mg. Pt states he is hs asymptomatic and has taken his medication this am. He had an appointment her on 07/24/2018 No showed for it.Spoke with Dr.Scott Luking, asked suggestion. Per Dr.Scott Luking have pt come in today for evaluation. Patient states he can not come today,but can come in the am. He was transferred up front and told to come her in the am for his appt to have his bp evaluated.

## 2018-08-01 ENCOUNTER — Encounter: Payer: Self-pay | Admitting: Family Medicine

## 2018-08-01 ENCOUNTER — Ambulatory Visit: Payer: Medicare Other

## 2018-08-01 ENCOUNTER — Ambulatory Visit: Payer: Medicare Other | Admitting: Podiatry

## 2018-08-01 ENCOUNTER — Ambulatory Visit (INDEPENDENT_AMBULATORY_CARE_PROVIDER_SITE_OTHER): Payer: Medicare Other | Admitting: Family Medicine

## 2018-08-01 VITALS — BP 220/112 | Wt 193.0 lb

## 2018-08-01 DIAGNOSIS — I10 Essential (primary) hypertension: Secondary | ICD-10-CM | POA: Diagnosis not present

## 2018-08-01 MED ORDER — HYDRALAZINE HCL 50 MG PO TABS: 50.0000 mg | ORAL_TABLET | Freq: Three times a day (TID) | ORAL | 5 refills | Status: DC

## 2018-08-01 NOTE — Progress Notes (Signed)
   Subjective:    Patient ID: Daryl Perkins, male    DOB: [birth year], 68 y.o.   MRN: [id number]  HPI  Patient is here today to follow up on his blood pressures.   Patient was at the soup kitchen yesterday and they checked his bp which was elevated and called the office to have the patient seen.Patient states he is taking his medications.  He is supposed to be on Amlodipine 10 mg one po qd, and Doxazosin 4 mg once daily.  He states he some times what he call migraine headaches, and he hurts all over. He states when he gets like that he just tries to relax at home.  He is still taking his two spoonfuls of mustard and a spoon full of vinegar Q day.He says this is supposed to help with his bp, but he is taking the prescribed medication also. Review of Systems  Constitutional: Negative for activity change, fatigue and fever.  HENT: Negative for congestion and rhinorrhea.   Respiratory: Negative for cough and shortness of breath.   Cardiovascular: Negative for chest pain and leg swelling.  Gastrointestinal: Negative for abdominal pain, diarrhea and nausea.  Genitourinary: Negative for dysuria and hematuria.  Neurological: Negative for weakness and headaches.  Psychiatric/Behavioral: Negative for agitation and behavioral problems.       Objective:   Physical Exam Vitals signs reviewed.  Constitutional:      General: He is not in acute distress. HENT:     Head: Normocephalic and atraumatic.  Eyes:     General:        Right eye: No discharge.        Left eye: No discharge.  Neck:     Trachea: No tracheal deviation.  Cardiovascular:     Rate and Rhythm: Normal rate and regular rhythm.     Heart sounds: Normal heart sounds. No murmur.  Pulmonary:     Effort: Pulmonary effort is normal. No respiratory distress.     Breath sounds: Normal breath sounds. No wheezing.  Lymphadenopathy:     Cervical: No cervical adenopathy.  Skin:    General: Skin is warm.     Findings: No rash.    Neurological:     Mental Status: He is alert.  Psychiatric:        Behavior: Behavior normal.   No sign of stroke going on denies any headaches denies nausea vomiting double vision or blurred vision        Assessment & Plan:  HTN Hard to tell whether he is taking his medicine We need to take this at face value I wrote down his medicines instructed him how to take them and added hydralazine 3 times daily we will recheck him in 1 week

## 2018-08-07 ENCOUNTER — Ambulatory Visit (INDEPENDENT_AMBULATORY_CARE_PROVIDER_SITE_OTHER): Payer: Medicare Other | Admitting: Family Medicine

## 2018-08-07 ENCOUNTER — Encounter: Payer: Self-pay | Admitting: Family Medicine

## 2018-08-07 VITALS — BP 224/114 | Ht 70.0 in | Wt 189.0 lb

## 2018-08-07 DIAGNOSIS — I10 Essential (primary) hypertension: Secondary | ICD-10-CM | POA: Diagnosis not present

## 2018-08-07 MED ORDER — DOXAZOSIN MESYLATE 8 MG PO TABS: 8.0000 mg | ORAL_TABLET | Freq: Every day | ORAL | 5 refills | Status: DC

## 2018-08-07 NOTE — Patient Instructions (Signed)
DASH Eating Plan  DASH stands for "Dietary Approaches to Stop Hypertension." The DASH eating plan is a healthy eating plan that has been shown to reduce high blood pressure (hypertension). It may also reduce your risk for type 2 diabetes, heart disease, and stroke. The DASH eating plan may also help with weight loss.  What are tips for following this plan?    General guidelines   Avoid eating more than 2,300 mg (milligrams) of salt (sodium) a day. If you have hypertension, you may need to reduce your sodium intake to 1,500 mg a day.   Limit alcohol intake to no more than 1 drink a day for nonpregnant women and 2 drinks a day for men. One drink equals 12 oz of beer, 5 oz of wine, or 1 oz of hard liquor.   Work with your health care provider to maintain a healthy body weight or to lose weight. Ask what an ideal weight is for you.   Get at least 30 minutes of exercise that causes your heart to beat faster (aerobic exercise) most days of the week. Activities may include walking, swimming, or biking.   Work with your health care provider or diet and nutrition specialist (dietitian) to adjust your eating plan to your individual calorie needs.  Reading food labels     Check food labels for the amount of sodium per serving. Choose foods with less than 5 percent of the Daily Value of sodium. Generally, foods with less than 300 mg of sodium per serving fit into this eating plan.   To find whole grains, look for the word "whole" as the first word in the ingredient list.  Shopping   Buy products labeled as "low-sodium" or "no salt added."   Buy fresh foods. Avoid canned foods and premade or frozen meals.  Cooking   Avoid adding salt when cooking. Use salt-free seasonings or herbs instead of table salt or sea salt. Check with your health care provider or pharmacist before using salt substitutes.   Do not fry foods. Cook foods using healthy methods such as baking, boiling, grilling, and broiling instead.   Cook with  heart-healthy oils, such as olive, canola, soybean, or sunflower oil.  Meal planning   Eat a balanced diet that includes:  ? 5 or more servings of fruits and vegetables each day. At each meal, try to fill half of your plate with fruits and vegetables.  ? Up to 6-8 servings of whole grains each day.  ? Less than 6 oz of lean meat, poultry, or fish each day. A 3-oz serving of meat is about the same size as a deck of cards. One egg equals 1 oz.  ? 2 servings of low-fat dairy each day.  ? A serving of nuts, seeds, or beans 5 times each week.  ? Heart-healthy fats. Healthy fats called Omega-3 fatty acids are found in foods such as flaxseeds and coldwater fish, like sardines, salmon, and mackerel.   Limit how much you eat of the following:  ? Canned or prepackaged foods.  ? Food that is high in trans fat, such as fried foods.  ? Food that is high in saturated fat, such as fatty meat.  ? Sweets, desserts, sugary drinks, and other foods with added sugar.  ? Full-fat dairy products.   Do not salt foods before eating.   Try to eat at least 2 vegetarian meals each week.   Eat more home-cooked food and less restaurant, buffet, and fast food.     When eating at a restaurant, ask that your food be prepared with less salt or no salt, if possible.  What foods are recommended?  The items listed may not be a complete list. Talk with your dietitian about what dietary choices are best for you.  Grains  Whole-grain or whole-wheat bread. Whole-grain or whole-wheat pasta. Brown rice. Oatmeal. Quinoa. Bulgur. Whole-grain and low-sodium cereals. Pita bread. Low-fat, low-sodium crackers. Whole-wheat flour tortillas.  Vegetables  Fresh or frozen vegetables (raw, steamed, roasted, or grilled). Low-sodium or reduced-sodium tomato and vegetable juice. Low-sodium or reduced-sodium tomato sauce and tomato paste. Low-sodium or reduced-sodium canned vegetables.  Fruits  All fresh, dried, or frozen fruit. Canned fruit in natural juice (without  added sugar).  Meat and other protein foods  Skinless chicken or turkey. Ground chicken or turkey. Pork with fat trimmed off. Fish and seafood. Egg whites. Dried beans, peas, or lentils. Unsalted nuts, nut butters, and seeds. Unsalted canned beans. Lean cuts of beef with fat trimmed off. Low-sodium, lean deli meat.  Dairy  Low-fat (1%) or fat-free (skim) milk. Fat-free, low-fat, or reduced-fat cheeses. Nonfat, low-sodium ricotta or cottage cheese. Low-fat or nonfat yogurt. Low-fat, low-sodium cheese.  Fats and oils  Soft margarine without trans fats. Vegetable oil. Low-fat, reduced-fat, or light mayonnaise and salad dressings (reduced-sodium). Canola, safflower, olive, soybean, and sunflower oils. Avocado.  Seasoning and other foods  Herbs. Spices. Seasoning mixes without salt. Unsalted popcorn and pretzels. Fat-free sweets.  What foods are not recommended?  The items listed may not be a complete list. Talk with your dietitian about what dietary choices are best for you.  Grains  Baked goods made with fat, such as croissants, muffins, or some breads. Dry pasta or rice meal packs.  Vegetables  Creamed or fried vegetables. Vegetables in a cheese sauce. Regular canned vegetables (not low-sodium or reduced-sodium). Regular canned tomato sauce and paste (not low-sodium or reduced-sodium). Regular tomato and vegetable juice (not low-sodium or reduced-sodium). Pickles. Olives.  Fruits  Canned fruit in a light or heavy syrup. Fried fruit. Fruit in cream or butter sauce.  Meat and other protein foods  Fatty cuts of meat. Ribs. Fried meat. Bacon. Sausage. Bologna and other processed lunch meats. Salami. Fatback. Hotdogs. Bratwurst. Salted nuts and seeds. Canned beans with added salt. Canned or smoked fish. Whole eggs or egg yolks. Chicken or turkey with skin.  Dairy  Whole or 2% milk, cream, and half-and-half. Whole or full-fat cream cheese. Whole-fat or sweetened yogurt. Full-fat cheese. Nondairy creamers. Whipped toppings.  Processed cheese and cheese spreads.  Fats and oils  Butter. Stick margarine. Lard. Shortening. Ghee. Bacon fat. Tropical oils, such as coconut, palm kernel, or palm oil.  Seasoning and other foods  Salted popcorn and pretzels. Onion salt, garlic salt, seasoned salt, table salt, and sea salt. Worcestershire sauce. Tartar sauce. Barbecue sauce. Teriyaki sauce. Soy sauce, including reduced-sodium. Steak sauce. Canned and packaged gravies. Fish sauce. Oyster sauce. Cocktail sauce. Horseradish that you find on the shelf. Ketchup. Mustard. Meat flavorings and tenderizers. Bouillon cubes. Hot sauce and Tabasco sauce. Premade or packaged marinades. Premade or packaged taco seasonings. Relishes. Regular salad dressings.  Where to find more information:   National Heart, Lung, and Blood Institute: www.nhlbi.nih.gov   American Heart Association: www.heart.org  Summary   The DASH eating plan is a healthy eating plan that has been shown to reduce high blood pressure (hypertension). It may also reduce your risk for type 2 diabetes, heart disease, and stroke.   With the   DASH eating plan, you should limit salt (sodium) intake to 2,300 mg a day. If you have hypertension, you may need to reduce your sodium intake to 1,500 mg a day.   When on the DASH eating plan, aim to eat more fresh fruits and vegetables, whole grains, lean proteins, low-fat dairy, and heart-healthy fats.   Work with your health care provider or diet and nutrition specialist (dietitian) to adjust your eating plan to your individual calorie needs.  This information is not intended to replace advice given to you by your health care provider. Make sure you discuss any questions you have with your health care provider.  Document Released: 06/08/2011 Document Revised: 06/12/2016 Document Reviewed: 06/12/2016  Elsevier Interactive Patient Education  2019 [employer]

## 2018-08-07 NOTE — Progress Notes (Signed)
   Subjective:    Patient ID: Daryl Perkins, male    DOB: [birth year], 68 y.o.   MRN: [id number]  HPI Patient is here today to follow up on his high blood pressure.Patient brought in by [name].  Per Niece pt has been taking his medication as prescribed. She has encouraged him to restrict salt and starches in his diet.  He takes Norvasc 10 mg once per day, Cardura is 4 mg once per day.hydralazine 50 mg Tid.  Review of Systems  Constitutional: Negative for activity change, fatigue and fever.  HENT: Negative for congestion and rhinorrhea.   Respiratory: Negative for cough and shortness of breath.   Cardiovascular: Negative for chest pain and leg swelling.  Gastrointestinal: Negative for abdominal pain, diarrhea and nausea.  Genitourinary: Negative for dysuria and hematuria.  Neurological: Negative for weakness and headaches.  Psychiatric/Behavioral: Negative for agitation and behavioral problems.       Objective:   Physical Exam Vitals signs reviewed.  Constitutional:      General: He is not in acute distress. HENT:     Head: Normocephalic and atraumatic.  Eyes:     General:        Right eye: No discharge.        Left eye: No discharge.  Neck:     Trachea: No tracheal deviation.  Cardiovascular:     Rate and Rhythm: Normal rate and regular rhythm.     Heart sounds: Normal heart sounds. No murmur.  Pulmonary:     Effort: Pulmonary effort is normal. No respiratory distress.     Breath sounds: Normal breath sounds.  Lymphadenopathy:     Cervical: No cervical adenopathy.  Skin:    General: Skin is warm and dry.  Neurological:     Mental Status: He is alert.     Coordination: Coordination normal.  Psychiatric:        Behavior: Behavior normal.           Assessment & Plan:  HTN- Patient was seen today as part of a visit regarding hypertension. The importance of healthy diet and regular physical activity was discussed. The importance of compliance with  medications discussed.  Ideal goal is to keep blood pressure low elevated levels certainly below 409/81 when possible.  The patient was counseled that keeping blood pressure under control lessen his risk of complications.  The importance of regular follow-ups was discussed with the patient.  Low-salt diet such as DASH recommended.  Regular physical activity was recommended as well.  Patient was advised to keep regular follow-ups. Blood pressure severely elevated but no symptoms currently  Blood pressure subpar increase Cardura 8 mg daily continue amlodipine not convinced the patient is taking all of his medicines I wrote them down went over him and his family member about how to properly take these follow-up again in 2 weeks time

## 2018-08-20 ENCOUNTER — Encounter: Payer: Self-pay | Admitting: Family Medicine

## 2018-08-20 ENCOUNTER — Ambulatory Visit (INDEPENDENT_AMBULATORY_CARE_PROVIDER_SITE_OTHER): Payer: Medicare Other | Admitting: Family Medicine

## 2018-08-20 VITALS — BP 160/90 | Wt 195.0 lb

## 2018-08-20 DIAGNOSIS — I10 Essential (primary) hypertension: Secondary | ICD-10-CM

## 2018-08-20 MED ORDER — HYDRALAZINE HCL 50 MG PO TABS: ORAL_TABLET | ORAL | 5 refills | Status: DC
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
## 2018-09-18 ENCOUNTER — Other Ambulatory Visit: Payer: Self-pay

## 2018-09-18 ENCOUNTER — Ambulatory Visit (INDEPENDENT_AMBULATORY_CARE_PROVIDER_SITE_OTHER): Payer: Medicare Other | Admitting: Family Medicine

## 2018-09-18 ENCOUNTER — Encounter: Payer: Self-pay | Admitting: Family Medicine

## 2018-09-18 VITALS — BP 174/88 | Wt 195.4 lb

## 2018-09-18 DIAGNOSIS — I10 Essential (primary) hypertension: Secondary | ICD-10-CM

## 2018-09-18 MED ORDER — AMLODIPINE BESYLATE 10 MG PO TABS: 10.0000 mg | ORAL_TABLET | Freq: Every day | ORAL | 5 refills | Status: DC

## 2018-09-18 MED ORDER — HYDROCHLOROTHIAZIDE 12.5 MG PO CAPS: ORAL_CAPSULE | ORAL | 5 refills | Status: DC

## 2018-09-18 MED ORDER — POTASSIUM CHLORIDE ER 10 MEQ PO TBCR: EXTENDED_RELEASE_TABLET | ORAL | 5 refills | Status: DC

## 2018-09-18 NOTE — Progress Notes (Signed)
   Subjective:    Patient ID: Daryl Perkins, male    DOB: 30-May-1951, 68 y.o.   MRN: [id number]  Hypertension  This is a recurrent problem. Pertinent negatives include no chest pain, headaches or shortness of breath. There are no compliance problems.    Pt here today for one month follow up. Pt states he has been doing well. Pt has BP card from [facility] with BP readings. Patient states he is taking his blood pressure on a regular basis.  Denies any setbacks or any troubles recently.  Review of Systems  Constitutional: Negative for diaphoresis and fatigue.  HENT: Negative for congestion and rhinorrhea.   Respiratory: Negative for cough and shortness of breath.   Cardiovascular: Negative for chest pain and leg swelling.  Gastrointestinal: Negative for abdominal pain and diarrhea.  Skin: Negative for color change and rash.  Neurological: Negative for dizziness and headaches.  Psychiatric/Behavioral: Negative for behavioral problems and confusion.       Objective:   Physical Exam Vitals signs reviewed.  Constitutional:      General: He is not in acute distress. HENT:     Head: Normocephalic and atraumatic.  Eyes:     General:        Right eye: No discharge.        Left eye: No discharge.  Neck:     Trachea: No tracheal deviation.  Cardiovascular:     Rate and Rhythm: Normal rate and regular rhythm.     Heart sounds: Normal heart sounds. No murmur.  Pulmonary:     Effort: Pulmonary effort is normal. No respiratory distress.     Breath sounds: Normal breath sounds. No wheezing.  Lymphadenopathy:     Cervical: No cervical adenopathy.  Skin:    General: Skin is warm.     Findings: No rash.  Neurological:     Mental Status: He is alert.  Psychiatric:        Behavior: Behavior normal.           Assessment & Plan:  HTN decent control on the diastolic not good control on the systolic very important for the patient to do a better job of  watching diet We will go ahead and add a diuretic Follow-up within 4 weeks Follow-up sooner problems Patient was encouraged to minimize social contact to lessen the risk of having any severe issues

## 2018-11-18 ENCOUNTER — Other Ambulatory Visit: Payer: Self-pay

## 2018-11-18 ENCOUNTER — Ambulatory Visit (INDEPENDENT_AMBULATORY_CARE_PROVIDER_SITE_OTHER): Payer: Medicare Other | Admitting: Family Medicine

## 2018-11-18 DIAGNOSIS — I10 Essential (primary) hypertension: Secondary | ICD-10-CM

## 2018-11-18 NOTE — Progress Notes (Signed)
   Subjective:    Patient ID: Daryl Perkins, male    DOB: June 29, 1951, 68 y.o.   MRN: [id number] Audio and visual Hypertension  This is a chronic problem. Pertinent negatives include no chest pain, headaches or shortness of breath. (Pt not having any symptoms ) There are no compliance problems.   Pt niece is coming to help with his video visit.  Patient states that he is trying to do the best can try to watch his diet he is doing a lot of walking and he is staying away from other people as best as possible Virtual Visit via Video Note  I connected with Daryl Perkins on 11/18/18 at  1:10 PM EDT by a video enabled telemedicine application and verified that I am speaking with the correct person using two identifiers.  Location: Patient: home Provider: office   I discussed the limitations of evaluation and management by telemedicine and the availability of in person appointments. The patient expressed understanding and agreed to proceed.  History of Present Illness:    Observations/Objective:   Assessment and Plan:   Follow Up Instructions:    I discussed the assessment and treatment plan with the patient. The patient was provided an opportunity to ask questions and all were answered. The patient agreed with the plan and demonstrated an understanding of the instructions.   The patient was advised to call back or seek an in-person evaluation if the symptoms worsen or if the condition fails to improve as anticipated.  I provided 15  minutes of non-face-to-face time during this encounter.   Vicente Males, LPN     Review of Systems  Constitutional: Negative for activity change, fatigue and fever.  HENT: Negative for congestion and rhinorrhea.   Respiratory: Negative for cough and shortness of breath.   Cardiovascular: Negative for chest pain and leg swelling.  Gastrointestinal: Negative for abdominal pain, diarrhea and nausea.  Genitourinary: Negative for dysuria and  hematuria.  Neurological: Negative for weakness and headaches.  Psychiatric/Behavioral: Negative for agitation and behavioral problems.       Objective:   Physical Exam  Patient had virtual visit Appears to be in no distress Atraumatic Neuro able to relate and oriented No apparent resp distress Color normal  The patient states to the best of his knowledge his blood pressure is been doing well but unfortunately it is impossible to check her blood pressure via video     Assessment & Plan:  HTN- Patient was seen today as part of a visit regarding hypertension. The importance of healthy diet and regular physical activity was discussed. The importance of compliance with medications discussed.  Ideal goal is to keep blood pressure low elevated levels certainly below 299/37 when possible.  The patient was counseled that keeping blood pressure under control lessen his risk of complications.  The importance of regular follow-ups was discussed with the patient.  Low-salt diet such as DASH recommended.  Regular physical activity was recommended as well.  Patient was advised to keep regular follow-ups.  He states he will keep taking his medicine he will try to do the best he can following a diet plus also walk on a regular basis he will follow-up here for a in person visit in 3 months

## 2019-02-11 ENCOUNTER — Other Ambulatory Visit: Payer: Self-pay | Admitting: Family Medicine

## 2019-05-16 IMAGING — CT CT ABD-PELV W/ CM
2 of 5 series · 16 of 46 positions shown, 18 images · IV contrast (Isovue)
Comparison: 04/22/2010

CLINICAL DATA: Sudden onset of vomiting this evening with dyspnea
and abdominal pain.

EXAM:
CT ABDOMEN AND PELVIS WITH CONTRAST
TECHNIQUE: Multidetector CT imaging of the abdomen and pelvis was performed
using the standard protocol following bolus administration of
intravenous contrast.
CONTRAST:  100mL [id number] IOPAMIDOL ([id number]) INJECTION 61%

[Series 2: axial st · axial · 0.79mm/px · z∈[-366,-46]mm · 13 of 74 slices shown, 15 images]
[im 5/74  soft-tissue]
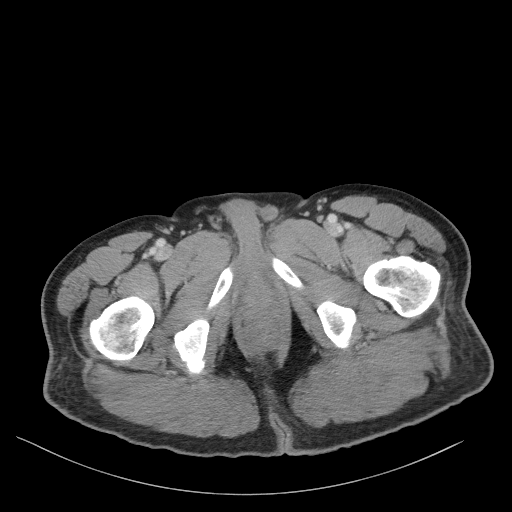
[im 5/74  bone]
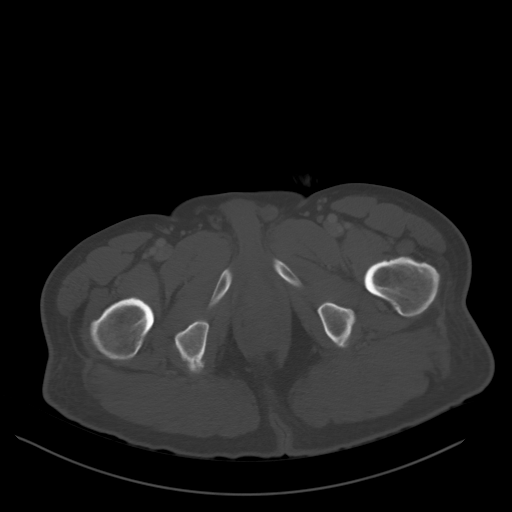
[im 9/74  soft-tissue]
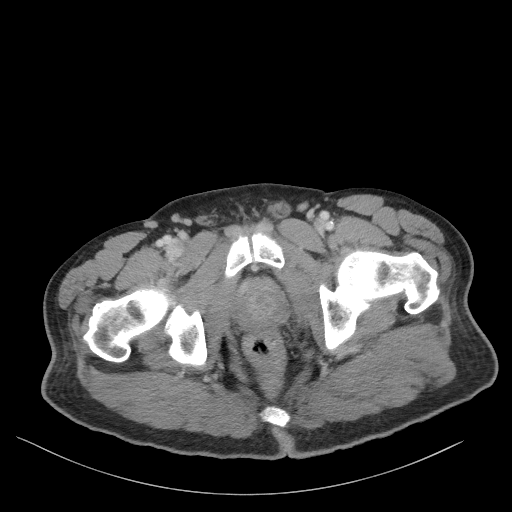
[im 17/74  soft-tissue]
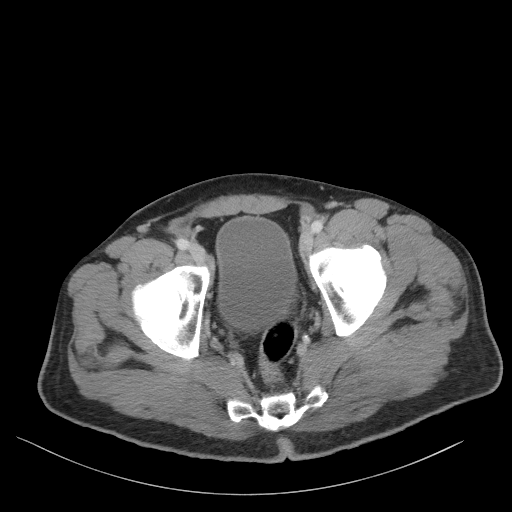
[im 21/74  soft-tissue]
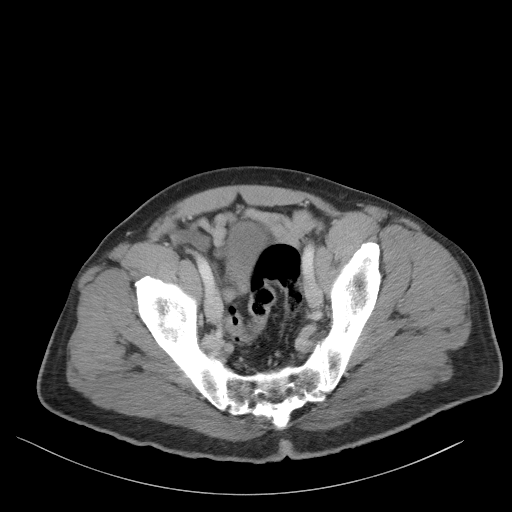
[im 25/74  soft-tissue]
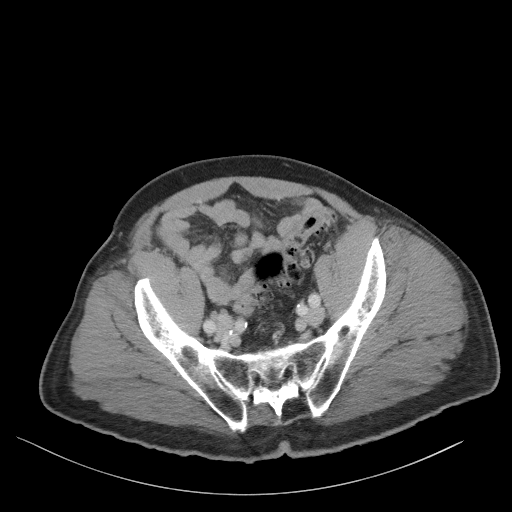
[im 33/74  soft-tissue]
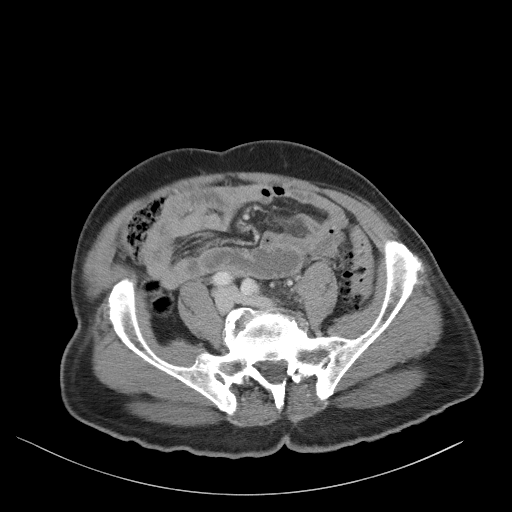
[im 37/74  soft-tissue]
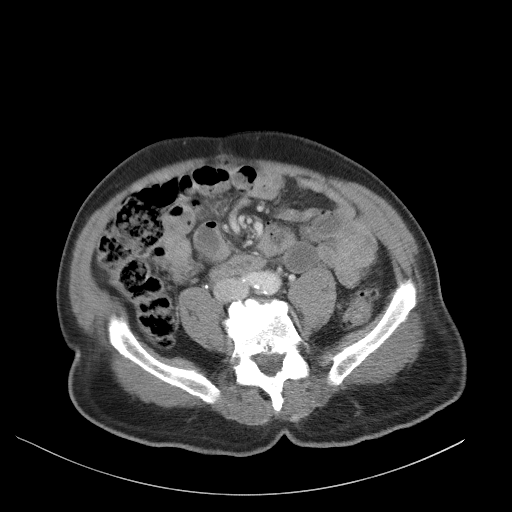
[im 41/74  soft-tissue]
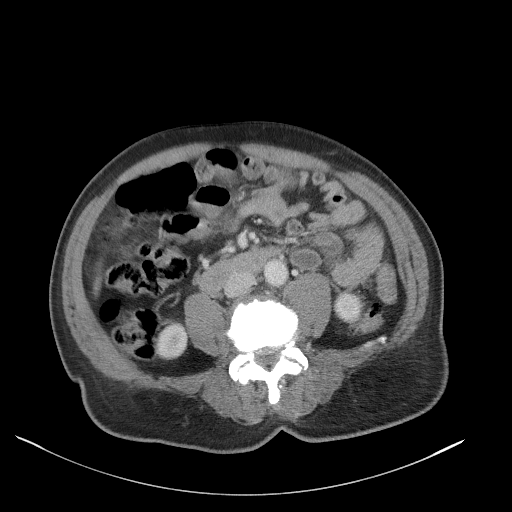
[im 49/74  soft-tissue]
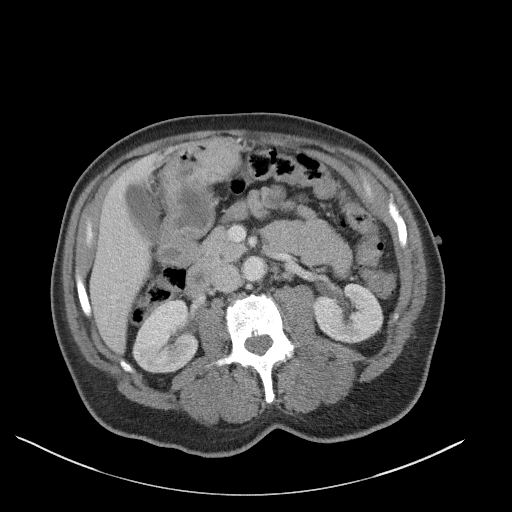
[im 49/74  bone]
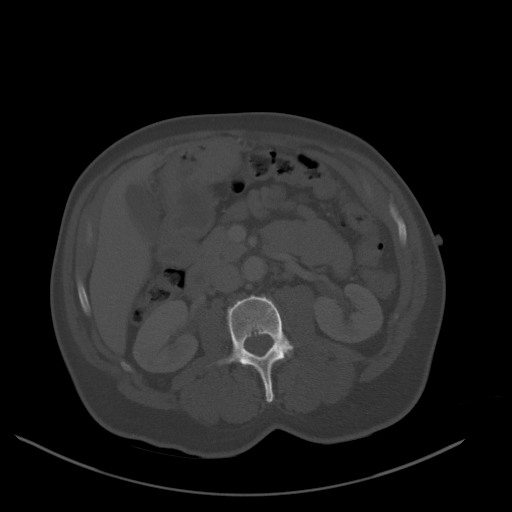
[im 53/74  soft-tissue]
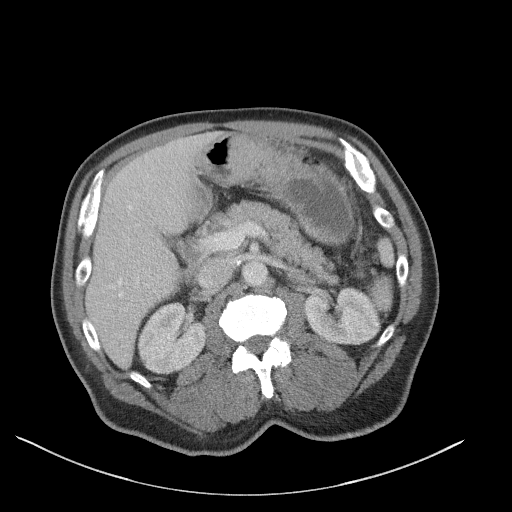
[im 57/74  soft-tissue]
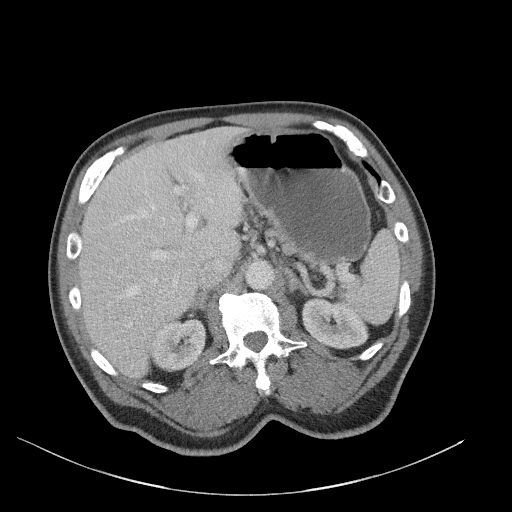
[im 65/74  soft-tissue]
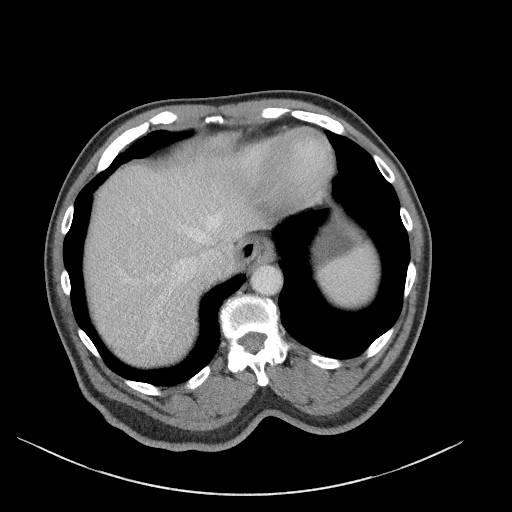
[im 69/74  soft-tissue]
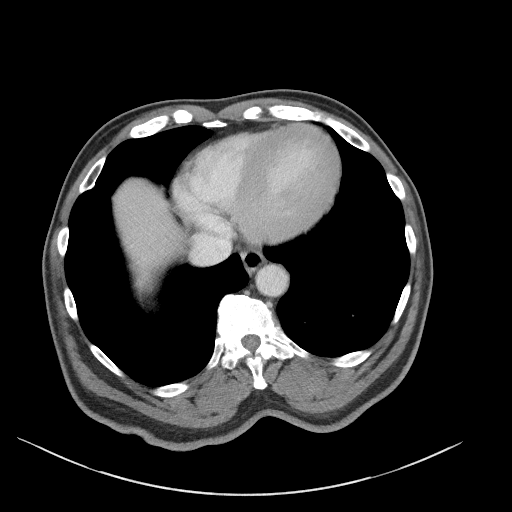

[Series 5: coronal st · coronal · 0.68mm/px · 3 of 85 slices shown]
[im 29/85  soft-tissue]
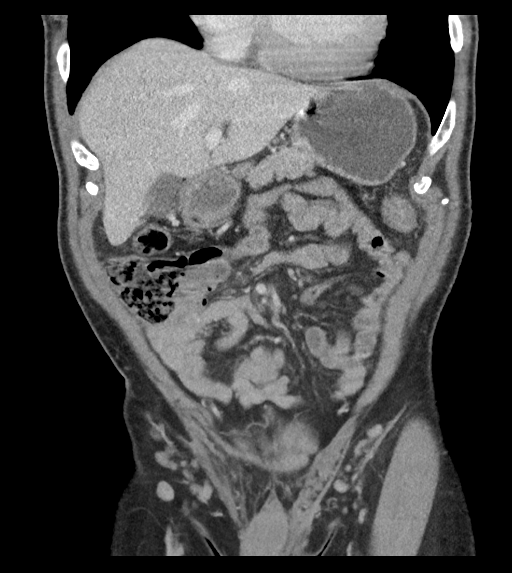
[im 38/85  soft-tissue]
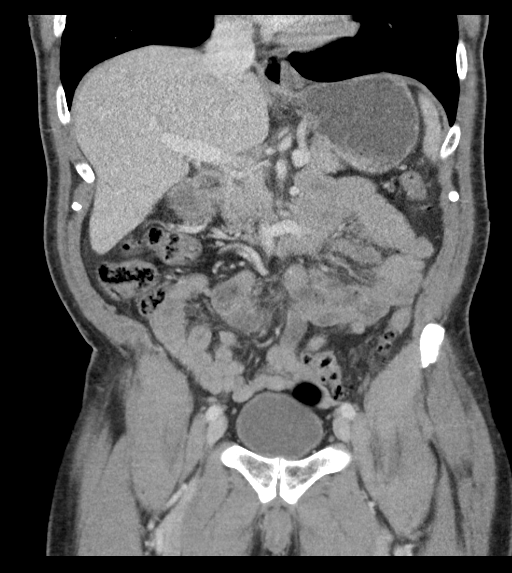
[im 47/85  soft-tissue]
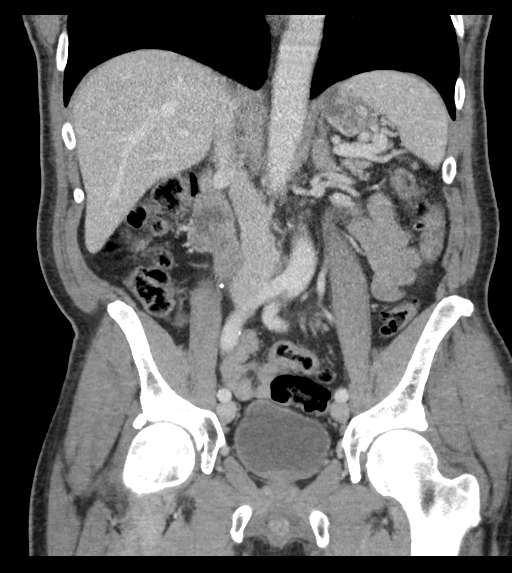

[16 of 46 positions shown; findings below may reference images not displayed]

FINDINGS: Lower chest: No acute abnormality.

Hepatobiliary: No focal liver abnormality is seen. No gallstones,
gallbladder wall thickening, or biliary dilatation.

Pancreas: Unremarkable. No pancreatic ductal dilatation or
surrounding inflammatory changes.

Spleen: Normal in size without focal abnormality.

Adrenals/Urinary Tract: Normal bilateral adrenal glands. Tiny
subcentimeter cyst in the lower pole of the right kidney too small
to further characterize measuring 6 mm. No nephrolithiasis nor
obstructive uropathy. The urinary bladder is unremarkable.

Stomach/Bowel: Small hiatal hernia. The distal body and antrum of
the stomach is thickened in appearance but this is may be in part
due to apposition of the walls. A gastritis is not entirely excluded
however, series 5, image 16. Normal small bowel rotation is noted.
Mild amount of fluid distention is seen of jejunal loops possibly
representing a mild enteritis. No mechanical source obstruction is
seen. There is scattered colonic diverticulosis more so along the
descending and sigmoid colon without acute diverticulitis. The
appendix is not confidently identified.

Vascular/Lymphatic: Aortic atherosclerosis. No enlarged abdominal or
pelvic lymph nodes.

Reproductive: Prostate is marginally enlarged.  No focal mass noted.

Other: Status post right inguinal hernia repair. No recurrence of
hernia.

Musculoskeletal: Degenerative disc disease L3 through S1 with
moderate partially calcified broad-based disc bulge at L4-5.
Multilevel associated lumbar facet arthropathy.
IMPRESSION: 1. Findings suggest gastroenteritis with thickened appearing gastric
mucosa and mild fluid-filled distention of small bowel loops without
mechanical obstruction identified. Small hiatal hernia is noted.
2. Lumbar spondylosis.
3. Tiny subcentimeter cyst too small to further characterize in the
lower pole the right kidney.
4. Status post right inguinal hernia repair. No recurrence of hernia
noted.

## 2019-07-28 ENCOUNTER — Ambulatory Visit: Payer: Medicare Other | Attending: Internal Medicine

## 2019-07-28 ENCOUNTER — Other Ambulatory Visit: Payer: Self-pay

## 2019-07-28 DIAGNOSIS — Z20822 Contact with and (suspected) exposure to covid-19: Secondary | ICD-10-CM

## 2019-07-29 LAB — NOVEL CORONAVIRUS, NAA: SARS-CoV-2, NAA: NOT DETECTED

## 2019-11-13 DIAGNOSIS — Z23 Encounter for immunization: Secondary | ICD-10-CM | POA: Diagnosis not present

## 2019-12-09 ENCOUNTER — Ambulatory Visit (INDEPENDENT_AMBULATORY_CARE_PROVIDER_SITE_OTHER): Payer: Medicare Other | Admitting: Family Medicine

## 2019-12-09 ENCOUNTER — Other Ambulatory Visit: Payer: Self-pay

## 2019-12-09 ENCOUNTER — Encounter: Payer: Self-pay | Admitting: Family Medicine

## 2019-12-09 DIAGNOSIS — I129 Hypertensive chronic kidney disease with stage 1 through stage 4 chronic kidney disease, or unspecified chronic kidney disease: Secondary | ICD-10-CM

## 2019-12-09 DIAGNOSIS — E782 Mixed hyperlipidemia: Secondary | ICD-10-CM

## 2019-12-09 DIAGNOSIS — M2042 Other hammer toe(s) (acquired), left foot: Secondary | ICD-10-CM

## 2019-12-09 DIAGNOSIS — Z23 Encounter for immunization: Secondary | ICD-10-CM

## 2019-12-09 DIAGNOSIS — Z1211 Encounter for screening for malignant neoplasm of colon: Secondary | ICD-10-CM

## 2019-12-09 DIAGNOSIS — IMO0001 Reserved for inherently not codable concepts without codable children: Secondary | ICD-10-CM

## 2019-12-09 DIAGNOSIS — M21612 Bunion of left foot: Secondary | ICD-10-CM | POA: Diagnosis not present

## 2019-12-09 DIAGNOSIS — Z79899 Other long term (current) drug therapy: Secondary | ICD-10-CM

## 2019-12-09 DIAGNOSIS — Z125 Encounter for screening for malignant neoplasm of prostate: Secondary | ICD-10-CM | POA: Diagnosis not present

## 2019-12-09 DIAGNOSIS — I10 Essential (primary) hypertension: Secondary | ICD-10-CM | POA: Diagnosis not present

## 2019-12-09 DIAGNOSIS — R7303 Prediabetes: Secondary | ICD-10-CM

## 2019-12-09 DIAGNOSIS — E1122 Type 2 diabetes mellitus with diabetic chronic kidney disease: Secondary | ICD-10-CM

## 2019-12-09 DIAGNOSIS — Z0000 Encounter for general adult medical examination without abnormal findings: Secondary | ICD-10-CM

## 2019-12-09 HISTORY — DX: Prediabetes: R73.03

## 2019-12-09 MED ORDER — HYDROCHLOROTHIAZIDE 12.5 MG PO CAPS: ORAL_CAPSULE | ORAL | 5 refills | Status: DC

## 2019-12-09 MED ORDER — AMLODIPINE BESYLATE 10 MG PO TABS: 10.0000 mg | ORAL_TABLET | Freq: Every day | ORAL | 5 refills | Status: DC

## 2019-12-09 MED ORDER — DOXAZOSIN MESYLATE 8 MG PO TABS: 8.0000 mg | ORAL_TABLET | Freq: Every day | ORAL | 2 refills | Status: DC

## 2019-12-09 MED ORDER — POTASSIUM CHLORIDE ER 10 MEQ PO TBCR: EXTENDED_RELEASE_TABLET | ORAL | 5 refills | Status: DC

## 2019-12-09 NOTE — Progress Notes (Signed)
Subjective:    Patient ID: CAM DAUPHIN, male    DOB: June 21, 1951, 69 y.o.   MRN: [id number]  HPI  AWV- Annual Wellness Visit  The patient was seen for their annual wellness visit. The patient's past medical history, surgical history, and family history were reviewed. Pertinent vaccines were reviewed ( tetanus, pneumonia, shingles, flu) The patient's medication list was reviewed and updated.  The height and weight were entered.  BMI recorded in electronic record elsewhere  Cognitive screening was completed. Outcome of Mini - Cog: Fail   Falls /depression screening electronically recorded within record elsewhere  Current tobacco usage: none (All patients who use tobacco were given written and verbal information on quitting)  Recent listing of emergency department/hospitalizations over the past year were reviewed.  current specialist the patient sees on a regular basis: none   Medicare annual wellness visit patient questionnaire was reviewed.  A written screening schedule for the patient for the next 5-10 years was given. Appropriate discussion of followup regarding next visit was discussed.     Review of Systems  Constitutional: Negative for activity change, appetite change and fever.  HENT: Negative for congestion and rhinorrhea.   Eyes: Negative for discharge.  Respiratory: Negative for cough and wheezing.   Cardiovascular: Negative for chest pain.  Gastrointestinal: Negative for abdominal pain, blood in stool and vomiting.  Genitourinary: Negative for difficulty urinating and frequency.  Musculoskeletal: Negative for neck pain.  Skin: Negative for rash.  Allergic/Immunologic: Negative for environmental allergies and food allergies.  Neurological: Negative for weakness and headaches.  Psychiatric/Behavioral: Negative for agitation.       Objective:   Physical Exam Constitutional:      Appearance: He is well-developed.  HENT:     Head: Normocephalic and  atraumatic.     Right Ear: External ear normal.     Left Ear: External ear normal.     Nose: Nose normal.  Eyes:     Pupils: Pupils are equal, round, and reactive to light.  Neck:     Thyroid: No thyromegaly.  Cardiovascular:     Rate and Rhythm: Normal rate and regular rhythm.     Heart sounds: Normal heart sounds. No murmur.  Pulmonary:     Effort: Pulmonary effort is normal. No respiratory distress.     Breath sounds: Normal breath sounds. No wheezing.  Abdominal:     General: Bowel sounds are normal. There is no distension.     Palpations: Abdomen is soft. There is no mass.     Tenderness: There is no abdominal tenderness.  Genitourinary:    Penis: Normal.      Prostate: Normal.  Musculoskeletal:        General: Normal range of motion.     Cervical back: Normal range of motion and neck supple.  Lymphadenopathy:     Cervical: No cervical adenopathy.  Skin:    General: Skin is warm and dry.     Findings: No erythema.  Neurological:     Mental Status: He is alert.     Motor: No abnormal muscle tone.  Psychiatric:        Behavior: Behavior normal.        Judgment: Judgment normal.           Assessment & Plan:  1. Essential hypertension, benign Noncompliance New medicines were sent in Recheck in 1 month Not under good control - Basic metabolic panel  2. Mixed hyperlipidemia Noncompliance medications were sent in lab work ordered -  Lipid panel  3. Encounter for subsequent annual wellness visit (AWV) in Medicare patient Adult wellness-complete.wellness physical was conducted today. Importance of diet and exercise were discussed in detail.  In addition to this a discussion regarding safety was also covered. We also reviewed over immunizations and gave recommendations regarding current immunization needed for age.  In addition to this additional areas were also touched on including: Preventative health exams needed:  Colonoscopy patient has been referred in the  past but never followed through with getting colonoscopy.  Referral again  Patient was advised yearly wellness exam   4. Bunion of great toe of left foot Patient has bunion and hammertoe would benefit from podiatry - Ambulatory referral to Podiatry  5. Hammer toe of left foot See above - Ambulatory referral to Podiatry  6. Type 2 DM with CKD and hypertension ([place]) Patient not a diabetic this was put in my care patient has prediabetes - Hemoglobin A1c  7. High risk medication use Liver function recommended - Hepatic function panel  8. Screening for colon cancer Referral for colonoscopy not sure patient will follow through - Ambulatory referral to Gastroenterology

## 2019-12-10 DIAGNOSIS — E782 Mixed hyperlipidemia: Secondary | ICD-10-CM | POA: Diagnosis not present

## 2019-12-10 DIAGNOSIS — I129 Hypertensive chronic kidney disease with stage 1 through stage 4 chronic kidney disease, or unspecified chronic kidney disease: Secondary | ICD-10-CM | POA: Diagnosis not present

## 2019-12-10 DIAGNOSIS — I10 Essential (primary) hypertension: Secondary | ICD-10-CM | POA: Diagnosis not present

## 2019-12-10 DIAGNOSIS — E1122 Type 2 diabetes mellitus with diabetic chronic kidney disease: Secondary | ICD-10-CM | POA: Diagnosis not present

## 2019-12-10 DIAGNOSIS — Z79899 Other long term (current) drug therapy: Secondary | ICD-10-CM | POA: Diagnosis not present

## 2019-12-11 ENCOUNTER — Encounter: Payer: Self-pay | Admitting: Family Medicine

## 2019-12-11 DIAGNOSIS — Z23 Encounter for immunization: Secondary | ICD-10-CM | POA: Diagnosis not present

## 2019-12-11 LAB — BASIC METABOLIC PANEL
BUN/Creatinine Ratio: 12 (ref 10–24)
BUN: 21 mg/dL (ref 8–27)
CO2: 25 mmol/L (ref 20–29)
Calcium: 9.7 mg/dL (ref 8.6–10.2)
Chloride: 104 mmol/L (ref 96–106)
Creatinine, Ser: 1.73 mg/dL — ABNORMAL HIGH (ref 0.76–1.27)
GFR calc Af Amer: 46 mL/min/{1.73_m2} — ABNORMAL LOW (ref >59)
GFR calc non Af Amer: 40 mL/min/{1.73_m2} — ABNORMAL LOW (ref >59)
Glucose: 110 mg/dL — ABNORMAL HIGH (ref 65–99)
Potassium: 4 mmol/L (ref 3.5–5.2)
Sodium: 143 mmol/L (ref 134–144)

## 2019-12-11 LAB — HEPATIC FUNCTION PANEL
ALT: 9 IU/L (ref 0–44)
AST: 15 IU/L (ref 0–40)
Albumin: 4.4 g/dL (ref 3.8–4.8)
Alkaline Phosphatase: 111 IU/L (ref 48–121)
Bilirubin Total: 0.4 mg/dL (ref 0.0–1.2)
Bilirubin, Direct: 0.11 mg/dL (ref 0.00–0.40)
Total Protein: 7.8 g/dL (ref 6.0–8.5)

## 2019-12-11 LAB — LIPID PANEL
Chol/HDL Ratio: 5.4 ratio — ABNORMAL HIGH (ref 0.0–5.0)
Cholesterol, Total: 249 mg/dL — ABNORMAL HIGH (ref 100–199)
HDL: 46 mg/dL (ref >39)
LDL Chol Calc (NIH): 186 mg/dL — ABNORMAL HIGH (ref 0–99)
Triglycerides: 97 mg/dL (ref 0–149)
VLDL Cholesterol Cal: 17 mg/dL (ref 5–40)

## 2019-12-11 LAB — HEMOGLOBIN A1C
Est. average glucose Bld gHb Est-mCnc: 143 mg/dL
Hgb A1c MFr Bld: 6.6 % — ABNORMAL HIGH (ref 4.8–5.6)

## 2019-12-24 ENCOUNTER — Encounter: Payer: Self-pay | Admitting: Family Medicine

## 2019-12-29 ENCOUNTER — Encounter: Payer: Self-pay | Admitting: Internal Medicine

## 2020-01-08 ENCOUNTER — Ambulatory Visit (INDEPENDENT_AMBULATORY_CARE_PROVIDER_SITE_OTHER): Payer: Medicare Other | Admitting: Family Medicine

## 2020-01-08 ENCOUNTER — Other Ambulatory Visit: Payer: Self-pay

## 2020-01-08 VITALS — BP 146/86 | Temp 97.8°F | Wt 186.6 lb

## 2020-01-08 DIAGNOSIS — I10 Essential (primary) hypertension: Secondary | ICD-10-CM | POA: Diagnosis not present

## 2020-01-08 DIAGNOSIS — N289 Disorder of kidney and ureter, unspecified: Secondary | ICD-10-CM | POA: Diagnosis not present

## 2020-01-08 DIAGNOSIS — E782 Mixed hyperlipidemia: Secondary | ICD-10-CM | POA: Diagnosis not present

## 2020-01-08 DIAGNOSIS — M21619 Bunion of unspecified foot: Secondary | ICD-10-CM | POA: Diagnosis not present

## 2020-01-08 DIAGNOSIS — Z79899 Other long term (current) drug therapy: Secondary | ICD-10-CM

## 2020-01-08 MED ORDER — ROSUVASTATIN CALCIUM 5 MG PO TABS
5.0000 mg | ORAL_TABLET | Freq: Every day | ORAL | 3 refills | Status: DC
Start: 2020-01-08 — End: 2020-03-31

## 2020-01-08 NOTE — Progress Notes (Signed)
   Subjective:    Patient ID: Daryl Perkins, male    DOB: May 01, 1951, 69 y.o.   MRN: [id number]  HPI  Patient comes in today to follow up on hypertension. He relates he is taking his medicine.  He does a lot of walking.  Patient never got a call from podiatry but it appears that the phone number in his system was incorrect.  Patient recently had lab work which shows some issues including elevated cholesterol and elevated creatinine  Review of Systems  Constitutional: Negative for diaphoresis and fatigue.  HENT: Negative for congestion and rhinorrhea.   Respiratory: Negative for cough and shortness of breath.   Cardiovascular: Negative for chest pain and leg swelling.  Gastrointestinal: Negative for abdominal pain and diarrhea.  Skin: Negative for color change and rash.  Neurological: Negative for dizziness and headaches.  Psychiatric/Behavioral: Negative for behavioral problems and confusion.       Objective:   Physical Exam Vitals reviewed.  Constitutional:      General: He is not in acute distress. HENT:     Head: Normocephalic and atraumatic.  Eyes:     General:        Right eye: No discharge.        Left eye: No discharge.  Neck:     Trachea: No tracheal deviation.  Cardiovascular:     Rate and Rhythm: Normal rate and regular rhythm.     Heart sounds: Normal heart sounds. No murmur heard.   Pulmonary:     Effort: Pulmonary effort is normal. No respiratory distress.     Breath sounds: Normal breath sounds.  Lymphadenopathy:     Cervical: No cervical adenopathy.  Skin:    General: Skin is warm and dry.  Neurological:     Mental Status: He is alert.     Coordination: Coordination normal.  Psychiatric:        Behavior: Behavior normal.           Assessment & Plan:  1. Essential hypertension, benign Blood pressure good control  compared to where it was continue current medication.  Stay physically active walking on a regular basis recheck again in 4 to 6  weeks  2. Mixed hyperlipidemia Lipid profile recommended - Lipid panel  3. Renal insufficiency Significant elevation of creatinine chronic kidney disease probably related to blood pressure needs further evaluation by nephrology.  Lab work pending - Ambulatory referral to Nephrology - Basic metabolic panel  4. Bunion Referral to podiatry - Ambulatory referral to Podiatry  5. Abnormal kidney function Referral to nephrology  6. High risk medication use Labs ordered - Hepatic function panel

## 2020-01-08 NOTE — Patient Instructions (Addendum)
DASH Eating Plan DASH stands for "Dietary Approaches to Stop Hypertension." The DASH eating plan is a healthy eating plan that has been shown to reduce high blood pressure (hypertension). It may also reduce your risk for type 2 diabetes, heart disease, and stroke. The DASH eating plan may also help with weight loss. What are tips for following this plan?  General guidelines  Avoid eating more than 2,300 mg (milligrams) of salt (sodium) a day. If you have hypertension, you may need to reduce your sodium intake to 1,500 mg a day.  Limit alcohol intake to no more than 1 drink a day for nonpregnant women and 2 drinks a day for men. One drink equals 12 oz of beer, 5 oz of wine, or 1 oz of hard liquor.  Work with your health care provider to maintain a healthy body weight or to lose weight. Ask what an ideal weight is for you.  Get at least 30 minutes of exercise that causes your heart to beat faster (aerobic exercise) most days of the week. Activities may include walking, swimming, or biking.  Work with your health care provider or diet and nutrition specialist (dietitian) to adjust your eating plan to your individual calorie needs. Reading food labels   Check food labels for the amount of sodium per serving. Choose foods with less than 5 percent of the Daily Value of sodium. Generally, foods with less than 300 mg of sodium per serving fit into this eating plan.  To find whole grains, look for the word "whole" as the first word in the ingredient list. Shopping  Buy products labeled as "low-sodium" or "no salt added."  Buy fresh foods. Avoid canned foods and premade or frozen meals. Cooking  Avoid adding salt when cooking. Use salt-free seasonings or herbs instead of table salt or sea salt. Check with your health care provider or pharmacist before using salt substitutes.  Do not fry foods. Cook foods using healthy methods such as baking, boiling, grilling, and broiling instead.  Cook with  heart-healthy oils, such as olive, canola, soybean, or sunflower oil. Meal planning  Eat a balanced diet that includes: ? 5 or more servings of fruits and vegetables each day. At each meal, try to fill half of your plate with fruits and vegetables. ? Up to 6-8 servings of whole grains each day. ? Less than 6 oz of lean meat, poultry, or fish each day. A 3-oz serving of meat is about the same size as a deck of cards. One egg equals 1 oz. ? 2 servings of low-fat dairy each day. ? A serving of nuts, seeds, or beans 5 times each week. ? Heart-healthy fats. Healthy fats called Omega-3 fatty acids are found in foods such as flaxseeds and coldwater fish, like sardines, salmon, and mackerel.  Limit how much you eat of the following: ? Canned or prepackaged foods. ? Food that is high in trans fat, such as fried foods. ? Food that is high in saturated fat, such as fatty meat. ? Sweets, desserts, sugary drinks, and other foods with added sugar. ? Full-fat dairy products.  Do not salt foods before eating.  Try to eat at least 2 vegetarian meals each week.  Eat more home-cooked food and less restaurant, buffet, and fast food.  When eating at a restaurant, ask that your food be prepared with less salt or no salt, if possible. What foods are recommended? The items listed may not be a complete list. Talk with your dietitian about   what dietary choices are best for you. Grains Whole-grain or whole-wheat bread. Whole-grain or whole-wheat pasta. Brown rice. Oatmeal. Quinoa. Bulgur. Whole-grain and low-sodium cereals. Pita bread. Low-fat, low-sodium crackers. Whole-wheat flour tortillas. Vegetables Fresh or frozen vegetables (raw, steamed, roasted, or grilled). Low-sodium or reduced-sodium tomato and vegetable juice. Low-sodium or reduced-sodium tomato sauce and tomato paste. Low-sodium or reduced-sodium canned vegetables. Fruits All fresh, dried, or frozen fruit. Canned fruit in natural juice (without  added sugar). Meat and other protein foods Skinless chicken or turkey. Ground chicken or turkey. Pork with fat trimmed off. Fish and seafood. Egg whites. Dried beans, peas, or lentils. Unsalted nuts, nut butters, and seeds. Unsalted canned beans. Lean cuts of beef with fat trimmed off. Low-sodium, lean deli meat. Dairy Low-fat (1%) or fat-free (skim) milk. Fat-free, low-fat, or reduced-fat cheeses. Nonfat, low-sodium ricotta or cottage cheese. Low-fat or nonfat yogurt. Low-fat, low-sodium cheese. Fats and oils Soft margarine without trans fats. Vegetable oil. Low-fat, reduced-fat, or light mayonnaise and salad dressings (reduced-sodium). Canola, safflower, olive, soybean, and sunflower oils. Avocado. Seasoning and other foods Herbs. Spices. Seasoning mixes without salt. Unsalted popcorn and pretzels. Fat-free sweets. What foods are not recommended? The items listed may not be a complete list. Talk with your dietitian about what dietary choices are best for you. Grains Baked goods made with fat, such as croissants, muffins, or some breads. Dry pasta or rice meal packs. Vegetables Creamed or fried vegetables. Vegetables in a cheese sauce. Regular canned vegetables (not low-sodium or reduced-sodium). Regular canned tomato sauce and paste (not low-sodium or reduced-sodium). Regular tomato and vegetable juice (not low-sodium or reduced-sodium). Pickles. Olives. Fruits Canned fruit in a light or heavy syrup. Fried fruit. Fruit in cream or butter sauce. Meat and other protein foods Fatty cuts of meat. Ribs. Fried meat. Bacon. Sausage. Bologna and other processed lunch meats. Salami. Fatback. Hotdogs. Bratwurst. Salted nuts and seeds. Canned beans with added salt. Canned or smoked fish. Whole eggs or egg yolks. Chicken or turkey with skin. Dairy Whole or 2% milk, cream, and half-and-half. Whole or full-fat cream cheese. Whole-fat or sweetened yogurt. Full-fat cheese. Nondairy creamers. Whipped toppings.  Processed cheese and cheese spreads. Fats and oils Butter. Stick margarine. Lard. Shortening. Ghee. Bacon fat. Tropical oils, such as coconut, palm kernel, or palm oil. Seasoning and other foods Salted popcorn and pretzels. Onion salt, garlic salt, seasoned salt, table salt, and sea salt. Worcestershire sauce. Tartar sauce. Barbecue sauce. Teriyaki sauce. Soy sauce, including reduced-sodium. Steak sauce. Canned and packaged gravies. Fish sauce. Oyster sauce. Cocktail sauce. Horseradish that you find on the shelf. Ketchup. Mustard. Meat flavorings and tenderizers. Bouillon cubes. Hot sauce and Tabasco sauce. Premade or packaged marinades. Premade or packaged taco seasonings. Relishes. Regular salad dressings. Where to find more information:  National Heart, Lung, and Blood Institute: www.nhlbi.nih.gov  American Heart Association: www.heart.org Summary  The DASH eating plan is a healthy eating plan that has been shown to reduce high blood pressure (hypertension). It may also reduce your risk for type 2 diabetes, heart disease, and stroke.  With the DASH eating plan, you should limit salt (sodium) intake to 2,300 mg a day. If you have hypertension, you may need to reduce your sodium intake to 1,500 mg a day.  When on the DASH eating plan, aim to eat more fresh fruits and vegetables, whole grains, lean proteins, low-fat dairy, and heart-healthy fats.  Work with your health care provider or diet and nutrition specialist (dietitian) to adjust your eating plan to your   individual calorie needs. This information is not intended to replace advice given to you by your health care provider. Make sure you discuss any questions you have with your health care provider. Document Revised: 06/01/2017 Document Reviewed: 06/12/2016 Elsevier Patient Education  2020 [employer]   Diabetes Mellitus and Nutrition, Adult When you have diabetes (diabetes mellitus), it is very important to have healthy eating  habits because your blood sugar (glucose) levels are greatly affected by what you eat and drink. Eating healthy foods in the appropriate amounts, at about the same times every day, can help you:  Control your blood glucose.  Lower your risk of heart disease.  Improve your blood pressure.  Reach or maintain a healthy weight. Every person with diabetes is different, and each person has different needs for a meal plan. Your health care provider may recommend that you work with a diet and nutrition specialist (dietitian) to make a meal plan that is best for you. Your meal plan may vary depending on factors such as:  The calories you need.  The medicines you take.  Your weight.  Your blood glucose, blood pressure, and cholesterol levels.  Your activity level.  Other health conditions you have, such as heart or kidney disease. How do carbohydrates affect me? Carbohydrates, also called carbs, affect your blood glucose level more than any other type of food. Eating carbs naturally raises the amount of glucose in your blood. Carb counting is a method for keeping track of how many carbs you eat. Counting carbs is important to keep your blood glucose at a healthy level, especially if you use insulin or take certain oral diabetes medicines. It is important to know how many carbs you can safely have in each meal. This is different for every person. Your dietitian can help you calculate how many carbs you should have at each meal and for each snack. Foods that contain carbs include:  Bread, cereal, rice, pasta, and crackers.  Potatoes and corn.  Peas, beans, and lentils.  Milk and yogurt.  Fruit and juice.  Desserts, such as cakes, cookies, ice cream, and candy. How does alcohol affect me? Alcohol can cause a sudden decrease in blood glucose (hypoglycemia), especially if you use insulin or take certain oral diabetes medicines. Hypoglycemia can be a life-threatening condition. Symptoms of  hypoglycemia (sleepiness, dizziness, and confusion) are similar to symptoms of having too much alcohol. If your health care provider says that alcohol is safe for you, follow these guidelines:  Limit alcohol intake to no more than 1 drink per day for nonpregnant women and 2 drinks per day for men. One drink equals 12 oz of beer, 5 oz of wine, or 1 oz of hard liquor.  Do not drink on an empty stomach.  Keep yourself hydrated with water, diet soda, or unsweetened iced tea.  Keep in mind that regular soda, juice, and other mixers may contain a lot of sugar and must be counted as carbs. What are tips for following this plan?  Reading food labels  Start by checking the serving size on the "Nutrition Facts" label of packaged foods and drinks. The amount of calories, carbs, fats, and other nutrients listed on the label is based on one serving of the item. Many items contain more than one serving per package.  Check the total grams (g) of carbs in one serving. You can calculate the number of servings of carbs in one serving by dividing the total carbs by 15. For example, if   a food has 30 g of total carbs, it would be equal to 2 servings of carbs.  Check the number of grams (g) of saturated and trans fats in one serving. Choose foods that have low or no amount of these fats.  Check the number of milligrams (mg) of salt (sodium) in one serving. Most people should limit total sodium intake to less than 2,300 mg per day.  Always check the nutrition information of foods labeled as "low-fat" or "nonfat". These foods may be higher in added sugar or refined carbs and should be avoided.  Talk to your dietitian to identify your daily goals for nutrients listed on the label. Shopping  Avoid buying canned, premade, or processed foods. These foods tend to be high in fat, sodium, and added sugar.  Shop around the outside edge of the grocery store. This includes fresh fruits and vegetables, bulk grains, fresh  meats, and fresh dairy. Cooking  Use low-heat cooking methods, such as baking, instead of high-heat cooking methods like deep frying.  Cook using healthy oils, such as olive, canola, or sunflower oil.  Avoid cooking with butter, cream, or high-fat meats. Meal planning  Eat meals and snacks regularly, preferably at the same times every day. Avoid going long periods of time without eating.  Eat foods high in fiber, such as fresh fruits, vegetables, beans, and whole grains. Talk to your dietitian about how many servings of carbs you can eat at each meal.  Eat 4-6 ounces (oz) of lean protein each day, such as lean meat, chicken, fish, eggs, or tofu. One oz of lean protein is equal to: ? 1 oz of meat, chicken, or fish. ? 1 egg. ?  cup of tofu.  Eat some foods each day that contain healthy fats, such as avocado, nuts, seeds, and fish. Lifestyle  Check your blood glucose regularly.  Exercise regularly as told by your health care provider. This may include: ? 150 minutes of moderate-intensity or vigorous-intensity exercise each week. This could be brisk walking, biking, or water aerobics. ? Stretching and doing strength exercises, such as yoga or weightlifting, at least 2 times a week.  Take medicines as told by your health care provider.  Do not use any products that contain nicotine or tobacco, such as cigarettes and e-cigarettes. If you need help quitting, ask your health care provider.  Work with a counselor or diabetes educator to identify strategies to manage stress and any emotional and social challenges. Questions to ask a health care provider  Do I need to meet with a diabetes educator?  Do I need to meet with a dietitian?  What number can I call if I have questions?  When are the best times to check my blood glucose? Where to find more information:  American Diabetes Association: diabetes.org  Academy of Nutrition and Dietetics: www.eatright.org  National Institute  of Diabetes and Digestive and Kidney Diseases (NIH): www.niddk.nih.gov Summary  A healthy meal plan will help you control your blood glucose and maintain a healthy lifestyle.  Working with a diet and nutrition specialist (dietitian) can help you make a meal plan that is best for you.  Keep in mind that carbohydrates (carbs) and alcohol have immediate effects on your blood glucose levels. It is important to count carbs and to use alcohol carefully. This information is not intended to replace advice given to you by your health care provider. Make sure you discuss any questions you have with your health care provider. Document Revised: 06/01/2017 Document   07/24/2016 Elsevier Patient Education  [place].

## 2020-01-27 ENCOUNTER — Encounter: Payer: Self-pay | Admitting: Family Medicine

## 2020-02-19 ENCOUNTER — Ambulatory Visit: Payer: Medicare Other

## 2020-03-01 ENCOUNTER — Other Ambulatory Visit: Payer: Self-pay | Admitting: Family Medicine

## 2020-03-01 DIAGNOSIS — M7740 Metatarsalgia, unspecified foot: Secondary | ICD-10-CM | POA: Diagnosis not present

## 2020-03-01 DIAGNOSIS — M79675 Pain in left toe(s): Secondary | ICD-10-CM | POA: Diagnosis not present

## 2020-03-01 DIAGNOSIS — M2042 Other hammer toe(s) (acquired), left foot: Secondary | ICD-10-CM | POA: Diagnosis not present

## 2020-03-01 DIAGNOSIS — M2010 Hallux valgus (acquired), unspecified foot: Secondary | ICD-10-CM | POA: Diagnosis not present

## 2020-03-31 ENCOUNTER — Other Ambulatory Visit: Payer: Self-pay | Admitting: Family Medicine

## 2020-04-01 ENCOUNTER — Other Ambulatory Visit: Payer: Self-pay

## 2020-04-01 ENCOUNTER — Ambulatory Visit: Payer: Self-pay

## 2020-04-12 ENCOUNTER — Ambulatory Visit (INDEPENDENT_AMBULATORY_CARE_PROVIDER_SITE_OTHER): Payer: Medicare Other | Admitting: Family Medicine

## 2020-04-12 ENCOUNTER — Encounter: Payer: Self-pay | Admitting: Family Medicine

## 2020-04-12 VITALS — BP 138/92 | HR 66 | Temp 97.4°F | Wt 185.8 lb

## 2020-04-12 DIAGNOSIS — I10 Essential (primary) hypertension: Secondary | ICD-10-CM

## 2020-04-12 DIAGNOSIS — I129 Hypertensive chronic kidney disease with stage 1 through stage 4 chronic kidney disease, or unspecified chronic kidney disease: Secondary | ICD-10-CM | POA: Diagnosis not present

## 2020-04-12 DIAGNOSIS — Z23 Encounter for immunization: Secondary | ICD-10-CM | POA: Diagnosis not present

## 2020-04-12 DIAGNOSIS — E1122 Type 2 diabetes mellitus with diabetic chronic kidney disease: Secondary | ICD-10-CM

## 2020-04-12 DIAGNOSIS — N289 Disorder of kidney and ureter, unspecified: Secondary | ICD-10-CM

## 2020-04-12 DIAGNOSIS — IMO0001 Reserved for inherently not codable concepts without codable children: Secondary | ICD-10-CM

## 2020-04-12 MED ORDER — HYDROCHLOROTHIAZIDE 12.5 MG PO CAPS
ORAL_CAPSULE | ORAL | 5 refills | Status: DC
Start: 2020-04-12 — End: 2020-10-20

## 2020-04-12 MED ORDER — AMLODIPINE BESYLATE 10 MG PO TABS: 10.0000 mg | ORAL_TABLET | Freq: Every day | ORAL | 5 refills | Status: DC

## 2020-04-12 MED ORDER — HYDRALAZINE HCL 100 MG PO TABS: ORAL_TABLET | ORAL | 5 refills | Status: DC

## 2020-04-12 MED ORDER — POTASSIUM CHLORIDE ER 10 MEQ PO TBCR
EXTENDED_RELEASE_TABLET | ORAL | 5 refills | Status: DC
Start: 2020-04-12 — End: 2020-10-21

## 2020-04-12 NOTE — Progress Notes (Signed)
Subjective:    Patient ID: Daryl Perkins, male    DOB: 1950/12/13, 69 y.o.   MRN: [id number]  HPI Pt here for HTN follow up. Does check blood pressure out side of office. No issues. Taking med as prescribed.  He says he takes the medicine as prescribed but when I talked to him about details he has a hard time relating details he did not bring his medications with him.  He does walk on a regular basis he states he tries to minimize salt in his diet.  Has a long history of noncompliance as well as elevated blood pressure denies any chest tightness pressure pain shortness of breath currently Essential hypertension, benign - Plan: Flu Vaccine QUAD High Dose(Fluad), Basic Metabolic Panel (BMET), Hemoglobin A1c  Type 2 DM with CKD and hypertension ([place]) - Plan: Flu Vaccine QUAD High Dose(Fluad), Basic Metabolic Panel (BMET), Hemoglobin A1c  Need for vaccination - Plan: Flu Vaccine QUAD High Dose(Fluad), Basic Metabolic Panel (BMET), Hemoglobin A1c  Renal insufficiency - Plan: Ambulatory referral to Nephrology   Review of Systems  Constitutional: Negative for diaphoresis and fatigue.  HENT: Negative for congestion and rhinorrhea.   Respiratory: Negative for cough and shortness of breath.   Cardiovascular: Negative for chest pain and leg swelling.  Gastrointestinal: Negative for abdominal pain and diarrhea.  Skin: Negative for color change and rash.  Neurological: Negative for dizziness and headaches.  Psychiatric/Behavioral: Negative for behavioral problems and confusion.       Objective:   Physical Exam Vitals reviewed.  Constitutional:      General: He is not in acute distress. HENT:     Head: Normocephalic and atraumatic.  Eyes:     General:        Right eye: No discharge.        Left eye: No discharge.  Neck:     Trachea: No tracheal deviation.  Cardiovascular:     Rate and Rhythm: Normal rate and regular rhythm.     Heart sounds: Normal heart sounds. No murmur heard.    Pulmonary:     Effort: Pulmonary effort is normal. No respiratory distress.     Breath sounds: Normal breath sounds.  Lymphadenopathy:     Cervical: No cervical adenopathy.  Skin:    General: Skin is warm and dry.  Neurological:     Mental Status: He is alert.     Coordination: Coordination normal.  Psychiatric:        Behavior: Behavior normal.           Assessment & Plan:  1. Essential hypertension, benign subpar control.  Needs adjustment in medicine.  Also compliance issues. Blood pressure elevated changing medicines to hydralazine subpar control I am not convinced this patient is taking his medicine as directed we went over each 1 today I have encouraged him to bring his bottles with him on his next visit and follow-up in 3 months - Flu Vaccine QUAD High Dose(Fluad) - Basic Metabolic Panel (BMET) - Hemoglobin A1c  2. Type 2 DM with CKD and hypertension ([facility]) Diabetes we will check L9J it is complicated by kidney disease as well as hypertension - Flu Vaccine QUAD High Dose(Fluad) - Basic Metabolic Panel (BMET) - Hemoglobin A1c  3. Need for vaccination Flu shot today - Flu Vaccine QUAD High Dose(Fluad) - Basic Metabolic Panel (BMET) - Hemoglobin A1c  4. Renal insufficiency Referral back to nephrologist.  The best I can tell from talking with the patient he did not  go see the nephrologist because of a mixup on the appointment - Ambulatory referral to Nephrology

## 2020-04-12 NOTE — Patient Instructions (Signed)
Please take your medicines daily  Amlodipine 10 mg one daily Doxazosin 8 mg one each evening Hydralazine new dose 100 mg one 3 times a day HCTZ 12.5 one daily Potassium One daily  Do your labs today  Recheck here in 4 months please bring your meds with you when you come  Flu shot today

## 2020-04-13 ENCOUNTER — Encounter: Payer: Self-pay | Admitting: Family Medicine

## 2020-04-13 LAB — BASIC METABOLIC PANEL
BUN/Creatinine Ratio: 11 (ref 10–24)
BUN: 14 mg/dL (ref 8–27)
CO2: 26 mmol/L (ref 20–29)
Calcium: 9 mg/dL (ref 8.6–10.2)
Chloride: 100 mmol/L (ref 96–106)
Creatinine, Ser: 1.32 mg/dL — ABNORMAL HIGH (ref 0.76–1.27)
GFR calc Af Amer: 64 mL/min/{1.73_m2} (ref >59)
GFR calc non Af Amer: 55 mL/min/{1.73_m2} — ABNORMAL LOW (ref >59)
Glucose: 99 mg/dL (ref 65–99)
Potassium: 4.1 mmol/L (ref 3.5–5.2)
Sodium: 137 mmol/L (ref 134–144)

## 2020-04-13 LAB — HEMOGLOBIN A1C
Est. average glucose Bld gHb Est-mCnc: 134 mg/dL
Hgb A1c MFr Bld: 6.3 % — ABNORMAL HIGH (ref 4.8–5.6)

## 2020-04-29 DIAGNOSIS — E1122 Type 2 diabetes mellitus with diabetic chronic kidney disease: Secondary | ICD-10-CM | POA: Diagnosis not present

## 2020-04-29 DIAGNOSIS — N189 Chronic kidney disease, unspecified: Secondary | ICD-10-CM | POA: Diagnosis not present

## 2020-04-29 DIAGNOSIS — I160 Hypertensive urgency: Secondary | ICD-10-CM | POA: Diagnosis not present

## 2020-04-29 DIAGNOSIS — E876 Hypokalemia: Secondary | ICD-10-CM | POA: Diagnosis not present

## 2020-04-29 DIAGNOSIS — D638 Anemia in other chronic diseases classified elsewhere: Secondary | ICD-10-CM | POA: Diagnosis not present

## 2020-04-29 DIAGNOSIS — Z79899 Other long term (current) drug therapy: Secondary | ICD-10-CM | POA: Diagnosis not present

## 2020-05-05 ENCOUNTER — Other Ambulatory Visit (HOSPITAL_COMMUNITY): Payer: Self-pay | Admitting: Nephrology

## 2020-05-05 ENCOUNTER — Other Ambulatory Visit: Payer: Self-pay | Admitting: Nephrology

## 2020-05-05 DIAGNOSIS — I160 Hypertensive urgency: Secondary | ICD-10-CM

## 2020-05-05 DIAGNOSIS — E876 Hypokalemia: Secondary | ICD-10-CM

## 2020-05-05 DIAGNOSIS — D638 Anemia in other chronic diseases classified elsewhere: Secondary | ICD-10-CM

## 2020-05-05 DIAGNOSIS — E1122 Type 2 diabetes mellitus with diabetic chronic kidney disease: Secondary | ICD-10-CM

## 2020-05-06 ENCOUNTER — Other Ambulatory Visit: Payer: Self-pay | Admitting: Nephrology

## 2020-05-06 ENCOUNTER — Other Ambulatory Visit (HOSPITAL_COMMUNITY): Payer: Self-pay | Admitting: Nephrology

## 2020-05-06 DIAGNOSIS — D631 Anemia in chronic kidney disease: Secondary | ICD-10-CM

## 2020-05-06 DIAGNOSIS — E876 Hypokalemia: Secondary | ICD-10-CM

## 2020-05-06 DIAGNOSIS — E1122 Type 2 diabetes mellitus with diabetic chronic kidney disease: Secondary | ICD-10-CM

## 2020-05-07 ENCOUNTER — Ambulatory Visit (HOSPITAL_COMMUNITY)
Admission: RE | Admit: 2020-05-07 | Discharge: 2020-05-07 | Disposition: A | Discharge status: Home / Self Care | Payer: Medicare Other | Source: Ambulatory Visit | Attending: Nephrology | Admitting: Nephrology

## 2020-05-07 ENCOUNTER — Other Ambulatory Visit: Payer: Self-pay

## 2020-05-07 DIAGNOSIS — D631 Anemia in chronic kidney disease: Secondary | ICD-10-CM | POA: Diagnosis not present

## 2020-05-07 DIAGNOSIS — N189 Chronic kidney disease, unspecified: Secondary | ICD-10-CM | POA: Diagnosis not present

## 2020-05-07 DIAGNOSIS — E1122 Type 2 diabetes mellitus with diabetic chronic kidney disease: Secondary | ICD-10-CM

## 2020-05-07 DIAGNOSIS — E876 Hypokalemia: Secondary | ICD-10-CM

## 2020-05-10 DIAGNOSIS — Z79899 Other long term (current) drug therapy: Secondary | ICD-10-CM | POA: Diagnosis not present

## 2020-05-10 DIAGNOSIS — E1122 Type 2 diabetes mellitus with diabetic chronic kidney disease: Secondary | ICD-10-CM | POA: Diagnosis not present

## 2020-05-10 DIAGNOSIS — E559 Vitamin D deficiency, unspecified: Secondary | ICD-10-CM | POA: Diagnosis not present

## 2020-05-10 DIAGNOSIS — D638 Anemia in other chronic diseases classified elsewhere: Secondary | ICD-10-CM | POA: Diagnosis not present

## 2020-05-10 DIAGNOSIS — I160 Hypertensive urgency: Secondary | ICD-10-CM | POA: Diagnosis not present

## 2020-05-10 DIAGNOSIS — E876 Hypokalemia: Secondary | ICD-10-CM | POA: Diagnosis not present

## 2020-05-10 DIAGNOSIS — N189 Chronic kidney disease, unspecified: Secondary | ICD-10-CM | POA: Diagnosis not present

## 2020-05-13 DIAGNOSIS — E1122 Type 2 diabetes mellitus with diabetic chronic kidney disease: Secondary | ICD-10-CM | POA: Diagnosis not present

## 2020-05-13 DIAGNOSIS — D638 Anemia in other chronic diseases classified elsewhere: Secondary | ICD-10-CM | POA: Diagnosis not present

## 2020-05-13 DIAGNOSIS — E876 Hypokalemia: Secondary | ICD-10-CM | POA: Diagnosis not present

## 2020-05-13 DIAGNOSIS — I160 Hypertensive urgency: Secondary | ICD-10-CM | POA: Diagnosis not present

## 2020-05-13 DIAGNOSIS — D508 Other iron deficiency anemias: Secondary | ICD-10-CM | POA: Diagnosis not present

## 2020-05-13 DIAGNOSIS — E559 Vitamin D deficiency, unspecified: Secondary | ICD-10-CM | POA: Diagnosis not present

## 2020-05-13 DIAGNOSIS — N189 Chronic kidney disease, unspecified: Secondary | ICD-10-CM | POA: Diagnosis not present

## 2020-05-18 ENCOUNTER — Other Ambulatory Visit: Payer: Self-pay

## 2020-05-18 ENCOUNTER — Ambulatory Visit (INDEPENDENT_AMBULATORY_CARE_PROVIDER_SITE_OTHER): Payer: Self-pay | Admitting: *Deleted

## 2020-05-18 VITALS — Ht 71.0 in | Wt 181.8 lb

## 2020-05-18 DIAGNOSIS — Z1211 Encounter for screening for malignant neoplasm of colon: Secondary | ICD-10-CM

## 2020-05-18 MED ORDER — PEG 3350-KCL-NA BICARB-NACL 420 G PO SOLR: 4000.0000 mL | Freq: Once | ORAL | 0 refills | Status: AC

## 2020-05-18 NOTE — Patient Instructions (Signed)
Daryl Perkins   01-03-1951 MRN: [id number]    Procedure Date: 06/09/2020 Time to register: 11:00 am Place to register: [facility] Procedure Time: 12:00 pm Scheduled provider: Dr. Gala Romney  PREPARATION FOR COLONOSCOPY WITH TRI-LYTE SPLIT PREP  Please notify us immediately if you are diabetic, take iron supplements, or if you are on Coumadin or any other blood thinners.   Please hold the following medications: n/a  You will need to purchase 1 fleet enema and 1 box of Bisacodyl $RemoveBefo'5mg'XvyqxNiIZSb$  tablets.   2 DAYS BEFORE PROCEDURE:  DATE: 06/07/2020   DAY: Monday Begin clear liquid diet AFTER your lunch meal. NO SOLID FOODS after this point.  1 DAY BEFORE PROCEDURE:  DATE: 06/08/2020   DAY: Tuesday Continue clear liquids the entire day - NO SOLID FOOD.   Diabetic medications adjustments for today: n/a  At 2:00 pm:  Take 2 Bisacodyl tablets.   At 4:00pm:  Start drinking your solution. Make sure you mix well per instructions on the bottle. Try to drink 1 (one) 8 ounce glass every 10-15 minutes until you have consumed HALF the jug. You should complete by 6:00pm.You must keep the left over solution refrigerated until completed next day.  Continue clear liquids. You must drink plenty of clear liquids to prevent dehyration and kidney failure.     DAY OF PROCEDURE:   DATE: 06/09/2020   DAY: Wednesday If you take medications for your heart, blood pressure or breathing, you may take these medications.  Diabetic medications adjustments for today: n/a  Five hours before your procedure time @ 7:00 am:  Finish remaining amout of bowel prep, drinking 1 (one) 8 ounce glass every 10-15 minutes until complete. You have two hours to consume remaining prep.   Three hours before your procedure time @ 9:00 am:  Nothing by mouth.   At least one hour before going to the hospital:  Give yourself one Fleet enema. You may take your morning medications with sip of water unless we have instructed otherwise.       Please see below for Dietary Information.  CLEAR LIQUIDS INCLUDE:  Water Jello (NOT red in color)   Ice Popsicles (NOT red in color)   Tea (sugar ok, no milk/cream) Powdered fruit flavored drinks  Coffee (sugar ok, no milk/cream) Gatorade/ Lemonade/ Kool-Aid  (NOT red in color)   Juice: apple, white grape, white cranberry Soft drinks  Clear bullion, consomme, broth (fat free beef/chicken/vegetable)  Carbonated beverages (any kind)  Strained chicken noodle soup Hard Candy   Remember: Clear liquids are liquids that will allow you to see your fingers on the other side of a clear glass. Be sure liquids are NOT red in color, and not cloudy, but CLEAR.  DO NOT EAT OR DRINK ANY OF THE FOLLOWING:  Dairy products of any kind   Cranberry juice Tomato juice / V8 juice   Grapefruit juice Orange juice     Red grape juice  Do not eat any solid foods, including such foods as: cereal, oatmeal, yogurt, fruits, vegetables, creamed soups, eggs, bread, crackers, pureed foods in a blender, etc.   HELPFUL HINTS FOR DRINKING PREP SOLUTION:   Make sure prep is extremely cold. Mix and refrigerate the the morning of the prep. You may also put in the freezer.   You may try mixing some Crystal Light or Country Time Lemonade if you prefer. Mix in small amounts; add more if necessary.  Try drinking through a straw  Rinse mouth with water  water or a mouthwash between glasses, to remove after-taste.  Try sipping on a cold beverage /ice/ popsicles between glasses of prep.  Place a piece of sugar-free hard candy in mouth between glasses.  If you become nauseated, try consuming smaller amounts, or stretch out the time between glasses. Stop for 30-60 minutes, then slowly start back drinking.        OTHER INSTRUCTIONS  You will need a responsible adult at least 68 years of age to accompany you and drive you home. This person must remain in the waiting room during your procedure. The hospital will cancel  your procedure if you do not have a responsible adult with you.   1. Wear loose fitting clothing that is easily removed. 2. Leave jewelry and other valuables at home.  3. Remove all body piercing jewelry and leave at home. 4. Total time from sign-in until discharge is approximately 2-3 hours. 5. You should go home directly after your procedure and rest. You can resume normal activities the day after your procedure. 6. The day of your procedure you should not:  Drive  Make legal decisions  Operate machinery  Drink alcohol  Return to work   You may call the office (Dept: [phone]) before 5:00pm, or page the doctor on call [phone]) after 5:00pm, for further instructions, if necessary.   Insurance Information YOU WILL NEED TO CHECK WITH YOUR INSURANCE COMPANY FOR THE BENEFITS OF COVERAGE YOU HAVE FOR THIS PROCEDURE.  UNFORTUNATELY, NOT ALL INSURANCE COMPANIES HAVE BENEFITS TO COVER ALL OR PART OF THESE TYPES OF PROCEDURES.  IT IS YOUR RESPONSIBILITY TO CHECK YOUR BENEFITS, HOWEVER, WE WILL BE GLAD TO ASSIST YOU WITH ANY CODES YOUR INSURANCE COMPANY MAY NEED.    PLEASE NOTE THAT MOST INSURANCE COMPANIES WILL NOT COVER A SCREENING COLONOSCOPY FOR PEOPLE UNDER THE AGE OF 50  IF YOU HAVE BCBS INSURANCE, YOU MAY HAVE BENEFITS FOR A SCREENING COLONOSCOPY BUT IF POLYPS ARE FOUND THE DIAGNOSIS WILL CHANGE AND THEN YOU MAY HAVE A DEDUCTIBLE THAT WILL NEED TO BE MET. SO PLEASE MAKE SURE YOU CHECK YOUR BENEFITS FOR A SCREENING COLONOSCOPY AS WELL AS A DIAGNOSTIC COLONOSCOPY.

## 2020-05-18 NOTE — Progress Notes (Signed)
Gastroenterology Pre-Procedure Review  Request Date: 05/18/2020 Requesting Physician: Dr. Sallee Lange, Last TCS done 02/14/2007 by Dr. Gala Romney, 4 mm polyp, benign colorectal type mucosa  PATIENT REVIEW QUESTIONS: The patient responded to the following health history questions as indicated:    1. Diabetes Melitis: yes, type II but controlled without medicine 2. Joint replacements in the past 12 months: no 3. Major health problems in the past 3 months: no 4. Has an artificial valve or MVP: no 5. Has a defibrillator: no 6. Has been advised in past to take antibiotics in advance of a procedure like teeth cleaning: no 7. Family history of colon cancer: no  8. Alcohol Use: no 9. Illicit drug Use: no 10. History of sleep apnea: no  11. History of coronary artery or other vascular stents placed within the last 12 months: no 12. History of any prior anesthesia complications: no 13. Body mass index is 25.36 kg/m.    MEDICATIONS & ALLERGIES:    Patient reports the following regarding taking any blood thinners:   Plavix? no Aspirin? no Coumadin? no Brilinta? no Xarelto? no Eliquis? no Pradaxa? no Savaysa? no Effient? no  Patient confirms/reports the following medications:  Current Outpatient Medications  Medication Sig Dispense Refill  . amLODipine (NORVASC) 10 MG tablet Take 1 tablet (10 mg total) by mouth daily. 30 tablet 5  . doxazosin (CARDURA) 8 MG tablet TAKE 1 TABLET BY MOUTH EVERY DAY 90 tablet 1  . hydrALAZINE (APRESOLINE) 100 MG tablet 1qam 32mid day and 1 qhs 90 tablet 5  . hydrochlorothiazide (MICROZIDE) 12.5 MG capsule Take one capsule by mouth daily 30 capsule 5  . potassium chloride (KLOR-CON) 10 MEQ tablet Take one tablet by mouth daily 30 tablet 5  . rosuvastatin (CRESTOR) 5 MG tablet TAKE 1 TABLET BY MOUTH EVERY DAY 90 tablet 1   No current facility-administered medications for this visit.    Patient confirms/reports the following allergies:  No Known  Allergies  No orders of the defined types were placed in this encounter.   AUTHORIZATION INFORMATION Primary Insurance: Medicare,  ID #:[id number] Pre-Cert / Auth required: No, not required  SCHEDULE INFORMATION: Procedure has been scheduled as follows:  Date: 06/09/2020 , Time: 12:00 Location: [facility] with Dr. Gala Romney  This Gastroenterology Pre-Precedure Review Form is being routed to the following provider(s): Aliene Altes, PA

## 2020-05-20 NOTE — Progress Notes (Signed)
OK to schedule. ASA II 

## 2020-06-07 ENCOUNTER — Other Ambulatory Visit (HOSPITAL_COMMUNITY)
Admission: RE | Admit: 2020-06-07 | Discharge: 2020-06-07 | Disposition: A | Discharge status: Home / Self Care | Payer: Medicare Other | Source: Ambulatory Visit | Attending: Internal Medicine | Admitting: Internal Medicine

## 2020-06-07 ENCOUNTER — Other Ambulatory Visit: Payer: Self-pay

## 2020-06-07 DIAGNOSIS — Z20822 Contact with and (suspected) exposure to covid-19: Secondary | ICD-10-CM | POA: Diagnosis not present

## 2020-06-07 DIAGNOSIS — Z01812 Encounter for preprocedural laboratory examination: Secondary | ICD-10-CM | POA: Insufficient documentation

## 2020-06-08 LAB — SARS CORONAVIRUS 2 (TAT 6-24 HRS): SARS Coronavirus 2: NEGATIVE

## 2020-06-09 ENCOUNTER — Other Ambulatory Visit: Payer: Self-pay

## 2020-06-09 ENCOUNTER — Encounter (HOSPITAL_COMMUNITY)
Admission: RE | Disposition: A | Discharge status: Home / Self Care | Payer: Self-pay | Source: Home / Self Care | Attending: Internal Medicine

## 2020-06-09 ENCOUNTER — Encounter (HOSPITAL_COMMUNITY): Payer: Self-pay | Admitting: Internal Medicine

## 2020-06-09 ENCOUNTER — Ambulatory Visit (HOSPITAL_COMMUNITY)
Admission: RE | Admit: 2020-06-09 | Discharge: 2020-06-09 | Disposition: A | Discharge status: Home / Self Care | Payer: Medicare Other | Attending: Internal Medicine | Admitting: Internal Medicine

## 2020-06-09 DIAGNOSIS — Z1211 Encounter for screening for malignant neoplasm of colon: Secondary | ICD-10-CM | POA: Insufficient documentation

## 2020-06-09 DIAGNOSIS — K635 Polyp of colon: Secondary | ICD-10-CM | POA: Diagnosis not present

## 2020-06-09 DIAGNOSIS — D128 Benign neoplasm of rectum: Secondary | ICD-10-CM | POA: Diagnosis not present

## 2020-06-09 DIAGNOSIS — K621 Rectal polyp: Secondary | ICD-10-CM

## 2020-06-09 DIAGNOSIS — Z79899 Other long term (current) drug therapy: Secondary | ICD-10-CM | POA: Diagnosis not present

## 2020-06-09 DIAGNOSIS — K5730 Diverticulosis of large intestine without perforation or abscess without bleeding: Secondary | ICD-10-CM | POA: Insufficient documentation

## 2020-06-09 DIAGNOSIS — D127 Benign neoplasm of rectosigmoid junction: Secondary | ICD-10-CM | POA: Insufficient documentation

## 2020-06-09 DIAGNOSIS — D123 Benign neoplasm of transverse colon: Secondary | ICD-10-CM | POA: Diagnosis not present

## 2020-06-09 HISTORY — PX: COLONOSCOPY: SHX5424

## 2020-06-09 HISTORY — PX: POLYPECTOMY: SHX5525

## 2020-06-09 SURGERY — COLONOSCOPY
Anesthesia: Moderate Sedation

## 2020-06-09 MED ORDER — MIDAZOLAM HCL 5 MG/5ML IJ SOLN
INTRAMUSCULAR | Status: AC
  Filled 2020-06-09: qty 10

## 2020-06-09 MED ORDER — MEPERIDINE HCL 100 MG/ML IJ SOLN
INTRAMUSCULAR | Status: DC | PRN
  Administered 2020-06-09: 15 mg via INTRAVENOUS
  Administered 2020-06-09: 25 mg via INTRAVENOUS

## 2020-06-09 MED ORDER — SODIUM CHLORIDE 0.9 % IV SOLN: INTRAVENOUS | Status: DC

## 2020-06-09 MED ORDER — ONDANSETRON HCL 4 MG/2ML IJ SOLN
INTRAMUSCULAR | Status: DC | PRN
  Administered 2020-06-09: 4 mg via INTRAVENOUS

## 2020-06-09 MED ORDER — MIDAZOLAM HCL 5 MG/5ML IJ SOLN
INTRAMUSCULAR | Status: DC | PRN
  Administered 2020-06-09 (×2): 1 mg via INTRAVENOUS
  Administered 2020-06-09: 2 mg via INTRAVENOUS

## 2020-06-09 MED ORDER — MEPERIDINE HCL 50 MG/ML IJ SOLN
INTRAMUSCULAR | Status: AC
  Filled 2020-06-09: qty 1

## 2020-06-09 MED ORDER — ONDANSETRON HCL 4 MG/2ML IJ SOLN
INTRAMUSCULAR | Status: AC
  Filled 2020-06-09: qty 2

## 2020-06-09 MED ORDER — STERILE WATER FOR IRRIGATION IR SOLN
Status: DC | PRN
  Administered 2020-06-09: 500 mL

## 2020-06-09 NOTE — Op Note (Signed)
[facility] Patient Name: Daryl Perkins Procedure Date: 06/09/2020 10:30 AM MRN: [id number] Date of Birth: July 27, 1950 Attending MD: Norvel Richards , MD CSN: [id number] Age: 69 Admit Type: Outpatient Procedure:                Colonoscopy Indications:              Screening for colorectal malignant neoplasm Providers:                Norvel Richards, MD, Lurline Del, RN, Dereck Leep, Technician, Nelma Rothman, Technician Referring MD:              Medicines:                Midazolam 4 mg IV, Meperidine 40 mg IV Complications:            No immediate complications. Estimated Blood Loss:     Estimated blood loss was minimal. Procedure:                Pre-Anesthesia Assessment:                           - Prior to the procedure, a History and Physical                            was performed, and patient medications and                            allergies were reviewed. The patient's tolerance of                            previous anesthesia was also reviewed. The risks                            and benefits of the procedure and the sedation                            options and risks were discussed with the patient.                            All questions were answered, and informed consent                            was obtained. Prior Anticoagulants: The patient has                            taken no previous anticoagulant or antiplatelet                            agents. ASA Grade Assessment: II - A patient with                            mild systemic disease. After reviewing the risks  and benefits, the patient was deemed in                            satisfactory condition to undergo the procedure.                           After obtaining informed consent, the colonoscope                            was passed under direct vision. Throughout the                            procedure, the patient's blood  pressure, pulse, and                            oxygen saturations were monitored continuously. The                            CF-[id number] ([id number]) scope was introduced through                            the anus and advanced to the the cecum, identified                            by appendiceal orifice and ileocecal valve. The                            colonoscopy was performed without difficulty. The                            patient tolerated the procedure well. The quality                            of the bowel preparation was adequate. Scope In: 10:58:21 AM Scope Out: 11:25:44 AM Scope Withdrawal Time: 0 hours 16 minutes 59 seconds  Total Procedure Duration: 0 hours 27 minutes 23 seconds  Findings:      The perianal and digital rectal examinations were normal.      Many small and large-mouthed diverticula were found in the sigmoid colon       and descending colon.      Three semi-pedunculated polyps were found in the rectum (largest       anorectal junction?"submitted separately and 1 rectosigmoid?"proximal       rectum ) and hepatic flexure. The polyps were 3 to 8 mm in size. These       polyps were removed with a cold snare.Marland Kitchen Resection and retrieval were       complete. Estimated blood loss was minimal.      The exam was otherwise without abnormality on direct and retroflexion       views. Impression:               - Diverticulosis in the sigmoid colon and in the                            descending colon.                           -  Three 3 to 8 mm polyps in the rectum and at the                            hepatic flexure, removed with a cold snare.                            Resected and retrieved.                           - The examination was otherwise normal on direct                            and retroflexion views. Moderate Sedation:      Moderate (conscious) sedation was personally administered by an       anesthesia professional. The following parameters were  monitored: oxygen       saturation, heart rate, blood pressure, and response to care. Total       physician intraservice time was 29 minutes. Recommendation:           - Patient has a contact number available for                            emergencies. The signs and symptoms of potential                            delayed complications were discussed with the                            patient. Return to normal activities tomorrow.                            Written discharge instructions were provided to the                            patient.                           - Advance diet as tolerated.                           - Continue present medications.                           - Repeat colonoscopy date to be determined after                            pending pathology results are reviewed for                            surveillance.                           - Return to GI office (date not yet determined). Procedure Code(s):        --- Professional ---  45380, Colonoscopy, flexible; with biopsy, single                            or multiple Diagnosis Code(s):        --- Professional ---                           Z12.11, Encounter for screening for malignant                            neoplasm of colon                           K62.1, Rectal polyp                           K63.5, Polyp of colon                           K57.30, Diverticulosis of large intestine without                            perforation or abscess without bleeding CPT copyright 2019 [employer]. All rights reserved. The codes documented in this report are preliminary and upon coder review may  be revised to meet current compliance requirements. Cristopher Estimable. Nasim Garofano, MD Norvel Richards, MD 06/09/2020 11:32:16 AM This report has been signed electronically. Number of Addenda: 0

## 2020-06-09 NOTE — Discharge Instructions (Signed)
Colonoscopy Discharge Instructions  Read the instructions outlined below and refer to this sheet in the next few weeks. These discharge instructions provide you with general information on caring for yourself after you leave the hospital. Your doctor may also give you specific instructions. While your treatment has been planned according to the most current medical practices available, unavoidable complications occasionally occur. If you have any problems or questions after discharge, call Dr. Gala Romney at [phone]. ACTIVITY  You may resume your regular activity, but move at a slower pace for the next 24 hours.   Take frequent rest periods for the next 24 hours.   Walking will help get rid of the air and reduce the bloated feeling in your belly (abdomen).   No driving for 24 hours (because of the medicine (anesthesia) used during the test).    Do not sign any important legal documents or operate any machinery for 24 hours (because of the anesthesia used during the test).  NUTRITION  Drink plenty of fluids.   You may resume your normal diet as instructed by your doctor.   Begin with a light meal and progress to your normal diet. Heavy or fried foods are harder to digest and may make you feel sick to your stomach (nauseated).   Avoid alcoholic beverages for 24 hours or as instructed.  MEDICATIONS  You may resume your normal medications unless your doctor tells you otherwise.  WHAT YOU CAN EXPECT TODAY  Some feelings of bloating in the abdomen.   Passage of more gas than usual.   Spotting of blood in your stool or on the toilet paper.  IF YOU HAD POLYPS REMOVED DURING THE COLONOSCOPY:  No aspirin products for 7 days or as instructed.   No alcohol for 7 days or as instructed.   Eat a soft diet for the next 24 hours.  FINDING OUT THE RESULTS OF YOUR TEST Not all test results are available during your visit. If your test results are not back during the visit, make an appointment  with your caregiver to find out the results. Do not assume everything is normal if you have not heard from your caregiver or the medical facility. It is important for you to follow up on all of your test results.  SEEK IMMEDIATE MEDICAL ATTENTION IF:  You have more than a spotting of blood in your stool.   Your belly is swollen (abdominal distention).   You are nauseated or vomiting.   You have a temperature over 101.   You have abdominal pain or discomfort that is severe or gets worse throughout the day.   Diverticulosis and polyp information provided  You will likely see a small amount of blood when you have a bowel movement the remainder of the day but it should taper off  Further recommendations to follow pending review of pathology report  At patient request, I called Ellie at [phone] -no answer     Diverticulosis  Diverticulosis is a condition that develops when small pouches (diverticula) form in the wall of the large intestine (colon). The colon is where water is absorbed and stool (feces) is formed. The pouches form when the inside layer of the colon pushes through weak spots in the outer layers of the colon. You may have a few pouches or many of them. The pouches usually do not cause problems unless they become inflamed or infected. When this happens, the condition is called diverticulitis. What are the causes? The cause of this condition is  not known. What increases the risk? The following factors may make you more likely to develop this condition:  Being older than age 65. Your risk for this condition increases with age. Diverticulosis is rare among people younger than age 36. By age 72, many people have it.  Eating a low-fiber diet.  Having frequent constipation.  Being overweight.  Not getting enough exercise.  Smoking.  Taking over-the-counter pain medicines, like aspirin and ibuprofen.  Having a family history of diverticulosis. What are the signs or  symptoms? In most people, there are no symptoms of this condition. If you do have symptoms, they may include:  Bloating.  Cramps in the abdomen.  Constipation or diarrhea.  Pain in the lower left side of the abdomen. How is this diagnosed? Because diverticulosis usually has no symptoms, it is most often diagnosed during an exam for other colon problems. The condition may be diagnosed by:  Using a flexible scope to examine the colon (colonoscopy).  Taking an X-ray of the colon after dye has been put into the colon (barium enema).  Having a CT scan. How is this treated? You may not need treatment for this condition. Your health care provider may recommend treatment to prevent problems. You may need treatment if you have symptoms or if you previously had diverticulitis. Treatment may include:  Eating a high-fiber diet.  Taking a fiber supplement.  Taking a live bacteria supplement (probiotic).  Taking medicine to relax your colon. Follow these instructions at home: Medicines  Take over-the-counter and prescription medicines only as told by your health care provider.  If told by your health care provider, take a fiber supplement or probiotic. Constipation prevention Your condition may cause constipation. To prevent or treat constipation, you may need to:  Drink enough fluid to keep your urine pale yellow.  Take over-the-counter or prescription medicines.  Eat foods that are high in fiber, such as beans, whole grains, and fresh fruits and vegetables.  Limit foods that are high in fat and processed sugars, such as fried or sweet foods.  General instructions  Try not to strain when you have a bowel movement.  Keep all follow-up visits as told by your health care provider. This is important. Contact a health care provider if you:  Have pain in your abdomen.  Have bloating.  Have cramps.  Have not had a bowel movement in 3 days. Get help right away if:  Your pain  gets worse.  Your bloating becomes very bad.  You have a fever or chills, and your symptoms suddenly get worse.  You vomit.  You have bowel movements that are bloody or black.  You have bleeding from your rectum. Summary  Diverticulosis is a condition that develops when small pouches (diverticula) form in the wall of the large intestine (colon).  You may have a few pouches or many of them.  This condition is most often diagnosed during an exam for other colon problems.  Treatment may include increasing the fiber in your diet, taking supplements, or taking medicines. This information is not intended to replace advice given to you by your health care provider. Make sure you discuss any questions you have with your health care provider. Document Revised: 01/16/2019 Document Reviewed: 01/16/2019 Elsevier Patient Education  [place].     Colon Polyps  Polyps are tissue growths inside the body. Polyps can grow in many places, including the large intestine (colon). A polyp may be a round bump or a mushroom-shaped growth.  You could have one polyp or several. Most colon polyps are noncancerous (benign). However, some colon polyps can become cancerous over time. Finding and removing the polyps early can help prevent this. What are the causes? The exact cause of colon polyps is not known. What increases the risk? You are more likely to develop this condition if you:  Have a family history of colon cancer or colon polyps.  Are older than 58 or older than 45 if you are African American.  Have inflammatory bowel disease, such as ulcerative colitis or Crohn's disease.  Have certain hereditary conditions, such as: ? Familial adenomatous polyposis. ? Lynch syndrome. ? Turcot syndrome. ? Peutz-Jeghers syndrome.  Are overweight.  Smoke cigarettes.  Do not get enough exercise.  Drink too much alcohol.  Eat a diet that is high in fat and red meat and low in fiber.  Had  childhood cancer that was treated with abdominal radiation. What are the signs or symptoms? Most polyps do not cause symptoms. If you have symptoms, they may include:  Blood coming from your rectum when having a bowel movement.  Blood in your stool. The stool may look dark red or black.  Abdominal pain.  A change in bowel habits, such as constipation or diarrhea. How is this diagnosed? This condition is diagnosed with a colonoscopy. This is a procedure in which a lighted, flexible scope is inserted into the anus and then passed into the colon to examine the area. Polyps are sometimes found when a colonoscopy is done as part of routine cancer screening tests. How is this treated? Treatment for this condition involves removing any polyps that are found. Most polyps can be removed during a colonoscopy. Those polyps will then be tested for cancer. Additional treatment may be needed depending on the results of testing. Follow these instructions at home: Lifestyle  Maintain a healthy weight, or lose weight if recommended by your health care provider.  Exercise every day or as told by your health care provider.  Do not use any products that contain nicotine or tobacco, such as cigarettes and e-cigarettes. If you need help quitting, ask your health care provider.  If you drink alcohol, limit how much you have: ? 0-1 drink a day for women. ? 0-2 drinks a day for men.  Be aware of how much alcohol is in your drink. In the [place] one drink equals one 12 oz bottle of beer (355 mL), one 5 oz glass of wine (148 mL), or one 1 oz shot of hard liquor (44 mL). Eating and drinking   Eat foods that are high in fiber, such as fruits, vegetables, and whole grains.  Eat foods that are high in calcium and vitamin D, such as milk, cheese, yogurt, eggs, liver, fish, and broccoli.  Limit foods that are high in fat, such as fried foods and desserts.  Limit the amount of red meat and processed meat you  eat, such as hot dogs, sausage, bacon, and lunch meats. General instructions  Keep all follow-up visits as told by your health care provider. This is important. ? This includes having regularly scheduled colonoscopies. ? Talk to your health care provider about when you need a colonoscopy. Contact a health care provider if:  You have new or worsening bleeding during a bowel movement.  You have new or increased blood in your stool.  You have a change in bowel habits.  You lose weight for no known reason. Summary  Polyps are tissue growths inside  the body. Polyps can grow in many places, including the colon.  Most colon polyps are noncancerous (benign), but some can become cancerous over time.  This condition is diagnosed with a colonoscopy.  Treatment for this condition involves removing any polyps that are found. Most polyps can be removed during a colonoscopy. This information is not intended to replace advice given to you by your health care provider. Make sure you discuss any questions you have with your health care provider. Document Revised: 10/04/2017 Document Reviewed: 10/04/2017 Elsevier Patient Education  [place].

## 2020-06-09 NOTE — H&P (Signed)
@LOGO @   Primary Care Physician:  Kathyrn Drown, MD Primary Gastroenterologist:  Dr. Gala Romney  Pre-Procedure History & Physical: HPI:  Daryl Perkins is a 69 y.o. male here for screening colonoscopy.  Last colonoscopy 2009-benign diminutive polyps removed.  No bowel symptoms.  Past Medical History:  Diagnosis Date  . Diabetes mellitus without complication ([place])   . HOH (hard of hearing)   . Hypertension   . Poor dental hygiene   . Prediabetes 12/09/2019    Past Surgical History:  Procedure Laterality Date  . [place]. lumbar disc  . INGUINAL HERNIA REPAIR Right 09/09/2012   Procedure: HERNIA REPAIR INGUINAL ADULT;  Surgeon: Jamesetta So, MD;  Location: [facility];  Service: General;  Laterality: Right;  . INSERTION OF MESH Right 09/09/2012   Procedure: INSERTION OF MESH;  Surgeon: Jamesetta So, MD;  Location: [facility];  Service: General;  Laterality: Right;    Prior to Admission medications   Medication Sig Start Date End Date Taking? Authorizing Provider  amLODipine (NORVASC) 10 MG tablet Take 1 tablet (10 mg total) by mouth daily. 04/12/20  Yes Kathyrn Drown, MD  doxazosin (CARDURA) 8 MG tablet TAKE 1 TABLET BY MOUTH EVERY DAY 03/01/20  Yes Sallee Lange A, MD  hydrALAZINE (APRESOLINE) 100 MG tablet 1qam 54mid day and 1 qhs 04/12/20  Yes Luking, Scott A, MD  hydrochlorothiazide (MICROZIDE) 12.5 MG capsule Take one capsule by mouth daily 04/12/20  Yes Luking, Elayne Snare, MD  potassium chloride (KLOR-CON) 10 MEQ tablet Take one tablet by mouth daily 04/12/20  Yes Luking, Scott A, MD  rosuvastatin (CRESTOR) 5 MG tablet TAKE 1 TABLET BY MOUTH EVERY DAY 03/31/20  Yes Kathyrn Drown, MD    Allergies as of 05/20/2020  . (No Known Allergies)    History reviewed. No pertinent family history.  Social History   Socioeconomic History  . Marital status: Divorced    Spouse name: Not on file  . Number of children: Not on file  . Years of education: Not on file  .  Highest education level: Not on file  Occupational History  . Not on file  Tobacco Use  . Smoking status: Never Smoker  . Smokeless tobacco: Never Used  Substance and Sexual Activity  . Alcohol use: No  . Drug use: No  . Sexual activity: Yes    Birth control/protection: None  Other Topics Concern  . Not on file  Social History Narrative  . Not on file   Social Determinants of Health   Financial Resource Strain:   . Difficulty of Paying Living Expenses: Not on file  Food Insecurity:   . Worried About Charity fundraiser in the Last Year: Not on file  . Ran Out of Food in the Last Year: Not on file  Transportation Needs:   . Lack of Transportation (Medical): Not on file  . Lack of Transportation (Non-Medical): Not on file  Physical Activity:   . Days of Exercise per Week: Not on file  . Minutes of Exercise per Session: Not on file  Stress:   . Feeling of Stress : Not on file  Social Connections:   . Frequency of Communication with Friends and Family: Not on file  . Frequency of Social Gatherings with Friends and Family: Not on file  . Attends Religious Services: Not on file  . Active Member of Clubs or Organizations: Not on file  . Attends Club or  Organization Meetings: Not on file  . Marital Status: Not on file  Intimate Partner Violence:   . Fear of Current or Ex-Partner: Not on file  . Emotionally Abused: Not on file  . Physically Abused: Not on file  . Sexually Abused: Not on file    Review of Systems: See HPI, otherwise negative ROS  Physical Exam: BP (!) 171/77   Pulse 69   Temp 98.7 F (37.1 C) (Oral)   Resp (!) 22   Ht 5\' 11"  (1.803 m)   Wt 81.6 kg   SpO2 99%   BMI 25.10 kg/m  General:   Alert,  Well-developed, well-nourished, pleasant and cooperative in NAD Neck:  Supple; no masses or thyromegaly. No significant cervical adenopathy. Lungs:  Clear throughout to auscultation.   No wheezes, crackles, or rhonchi. No acute distress. Heart:  Regular rate  and rhythm; no murmurs, clicks, rubs,  or gallops. Abdomen: Non-distended, normal bowel sounds.  Soft and nontender without appreciable mass or hepatosplenomegaly.  Pulses:  Normal pulses noted. Extremities:  Without clubbing or edema.  Impression/Plan: 69 year old gentleman here for average rescreening colonoscopy per plan. The risks, benefits, limitations, alternatives and imponderables have been reviewed with the patient. Questions have been answered. All parties are agreeable.      Notice: This dictation was prepared with Dragon dictation along with smaller phrase technology. Any transcriptional errors that result from this process are unintentional and may not be corrected upon review.

## 2020-06-10 ENCOUNTER — Encounter: Payer: Self-pay | Admitting: Internal Medicine

## 2020-06-10 LAB — SURGICAL PATHOLOGY

## 2020-06-15 ENCOUNTER — Encounter (HOSPITAL_COMMUNITY): Payer: Self-pay | Admitting: Internal Medicine

## 2020-06-18 DIAGNOSIS — E876 Hypokalemia: Secondary | ICD-10-CM | POA: Diagnosis not present

## 2020-06-18 DIAGNOSIS — D638 Anemia in other chronic diseases classified elsewhere: Secondary | ICD-10-CM | POA: Diagnosis not present

## 2020-06-18 DIAGNOSIS — E559 Vitamin D deficiency, unspecified: Secondary | ICD-10-CM | POA: Diagnosis not present

## 2020-06-18 DIAGNOSIS — N189 Chronic kidney disease, unspecified: Secondary | ICD-10-CM | POA: Diagnosis not present

## 2020-06-18 DIAGNOSIS — E1122 Type 2 diabetes mellitus with diabetic chronic kidney disease: Secondary | ICD-10-CM | POA: Diagnosis not present

## 2020-06-18 DIAGNOSIS — I129 Hypertensive chronic kidney disease with stage 1 through stage 4 chronic kidney disease, or unspecified chronic kidney disease: Secondary | ICD-10-CM | POA: Diagnosis not present

## 2020-08-26 DIAGNOSIS — I129 Hypertensive chronic kidney disease with stage 1 through stage 4 chronic kidney disease, or unspecified chronic kidney disease: Secondary | ICD-10-CM | POA: Diagnosis not present

## 2020-08-26 DIAGNOSIS — D638 Anemia in other chronic diseases classified elsewhere: Secondary | ICD-10-CM | POA: Diagnosis not present

## 2020-08-26 DIAGNOSIS — N189 Chronic kidney disease, unspecified: Secondary | ICD-10-CM | POA: Diagnosis not present

## 2020-08-26 DIAGNOSIS — E1122 Type 2 diabetes mellitus with diabetic chronic kidney disease: Secondary | ICD-10-CM | POA: Diagnosis not present

## 2020-09-03 DIAGNOSIS — N189 Chronic kidney disease, unspecified: Secondary | ICD-10-CM | POA: Diagnosis not present

## 2020-09-03 DIAGNOSIS — I129 Hypertensive chronic kidney disease with stage 1 through stage 4 chronic kidney disease, or unspecified chronic kidney disease: Secondary | ICD-10-CM | POA: Diagnosis not present

## 2020-09-03 DIAGNOSIS — D638 Anemia in other chronic diseases classified elsewhere: Secondary | ICD-10-CM | POA: Diagnosis not present

## 2020-09-03 DIAGNOSIS — E559 Vitamin D deficiency, unspecified: Secondary | ICD-10-CM | POA: Diagnosis not present

## 2020-10-20 ENCOUNTER — Other Ambulatory Visit: Payer: Self-pay | Admitting: Family Medicine

## 2020-10-21 ENCOUNTER — Other Ambulatory Visit: Payer: Self-pay | Admitting: Family Medicine

## 2020-11-04 ENCOUNTER — Other Ambulatory Visit: Payer: Self-pay | Admitting: Family Medicine

## 2020-11-05 NOTE — Telephone Encounter (Signed)
May have 90-day needs office visit

## 2020-11-18 NOTE — Telephone Encounter (Signed)
Sent my chart message to schedule appt

## 2020-11-26 DIAGNOSIS — E1122 Type 2 diabetes mellitus with diabetic chronic kidney disease: Secondary | ICD-10-CM | POA: Diagnosis not present

## 2020-11-26 DIAGNOSIS — I129 Hypertensive chronic kidney disease with stage 1 through stage 4 chronic kidney disease, or unspecified chronic kidney disease: Secondary | ICD-10-CM | POA: Diagnosis not present

## 2020-11-26 DIAGNOSIS — N189 Chronic kidney disease, unspecified: Secondary | ICD-10-CM | POA: Diagnosis not present

## 2020-11-26 DIAGNOSIS — D638 Anemia in other chronic diseases classified elsewhere: Secondary | ICD-10-CM | POA: Diagnosis not present

## 2020-12-01 DIAGNOSIS — I129 Hypertensive chronic kidney disease with stage 1 through stage 4 chronic kidney disease, or unspecified chronic kidney disease: Secondary | ICD-10-CM | POA: Diagnosis not present

## 2020-12-01 DIAGNOSIS — E559 Vitamin D deficiency, unspecified: Secondary | ICD-10-CM | POA: Diagnosis not present

## 2020-12-01 DIAGNOSIS — N189 Chronic kidney disease, unspecified: Secondary | ICD-10-CM | POA: Diagnosis not present

## 2020-12-01 DIAGNOSIS — D638 Anemia in other chronic diseases classified elsewhere: Secondary | ICD-10-CM | POA: Diagnosis not present

## 2020-12-15 NOTE — Telephone Encounter (Signed)
Tried calling twice no answer and also sent my chart message twice no response

## 2021-03-10 ENCOUNTER — Telehealth: Payer: Self-pay | Admitting: Family Medicine

## 2021-03-10 NOTE — Telephone Encounter (Signed)
No answer unable to leave a message for patient to call back and schedule Medicare Annual Wellness Visit (AWV) in office.   If unable to come into the office for AWV,  please offer to do virtually or by telephone.  Last AWV: 12/09/2019  Please schedule at anytime with [facility].  40 minute appointment  Any questions, please contact me at [phone]

## 2021-03-12 ENCOUNTER — Telehealth: Payer: Self-pay

## 2021-03-12 NOTE — Telephone Encounter (Signed)
Attempted to reach patient to complete AWV. No vm is set up, unable to leave a message at this time.

## 2022-01-17 ENCOUNTER — Ambulatory Visit (INDEPENDENT_AMBULATORY_CARE_PROVIDER_SITE_OTHER): Payer: Medicare Other | Admitting: Family Medicine

## 2022-01-17 VITALS — BP 210/110 | HR 69 | Temp 98.8°F | Ht 71.0 in | Wt 167.0 lb

## 2022-01-17 DIAGNOSIS — E782 Mixed hyperlipidemia: Secondary | ICD-10-CM | POA: Diagnosis not present

## 2022-01-17 DIAGNOSIS — I10 Essential (primary) hypertension: Secondary | ICD-10-CM

## 2022-01-17 DIAGNOSIS — Z91199 Patient's noncompliance with other medical treatment and regimen due to unspecified reason: Secondary | ICD-10-CM

## 2022-01-17 DIAGNOSIS — M21612 Bunion of left foot: Secondary | ICD-10-CM

## 2022-01-17 DIAGNOSIS — N1831 Chronic kidney disease, stage 3a: Secondary | ICD-10-CM

## 2022-01-17 DIAGNOSIS — E1122 Type 2 diabetes mellitus with diabetic chronic kidney disease: Secondary | ICD-10-CM

## 2022-01-17 DIAGNOSIS — M2042 Other hammer toe(s) (acquired), left foot: Secondary | ICD-10-CM | POA: Diagnosis not present

## 2022-01-17 MED ORDER — DOXAZOSIN MESYLATE 2 MG PO TABS: 2.0000 mg | ORAL_TABLET | Freq: Every day | ORAL | 1 refills | Status: DC

## 2022-01-17 MED ORDER — POTASSIUM CHLORIDE ER 10 MEQ PO TBCR: 10.0000 meq | EXTENDED_RELEASE_TABLET | Freq: Every day | ORAL | 0 refills | Status: DC

## 2022-01-17 MED ORDER — AMLODIPINE BESYLATE 10 MG PO TABS: 10.0000 mg | ORAL_TABLET | Freq: Every day | ORAL | 1 refills | Status: DC

## 2022-01-17 MED ORDER — HYDROCHLOROTHIAZIDE 12.5 MG PO CAPS: ORAL_CAPSULE | ORAL | 0 refills | Status: DC

## 2022-01-17 NOTE — Progress Notes (Signed)
   Subjective:    Patient ID: Daryl Perkins, male    DOB: [birth year], 71 y.o.   MRN: [id number]  Hypertension This is a chronic problem.    Daily headaches , has not been taking medicines Left foot deformity and pain - referral to foot specialist req.  Patient relates daily headaches not taking his medicine its been over 2 years since he has been here does a lot of walking his nieces are now taking an interest in trying to help him with getting his medications try and encourage him to take them on a regular basis and also to follow through with office visits.  He has had a long history of noncompliance missed appointments and missed labs as well as not getting medications filled  Review of Systems     Objective:   Physical Exam  General-in no acute distress Eyes-no discharge Lungs-respiratory rate normal, CTA CV-no murmurs,RRR Extremities skin warm dry no edema Neuro grossly normal Behavior normal, alert       Assessment & Plan:  1. Essential hypertension, benign Blood pressure not under good control, we did discuss healthy diet minimizing salt - Hemoglobin F8H - Basic metabolic panel - Lipid panel - Hepatic Function Panel - Microalbumin/Creatinine Ratio, Urine  2. Mixed hyperlipidemia Check lab work.  Restart medicine - Hemoglobin W2X - Basic metabolic panel - Lipid panel - Hepatic Function Panel - Microalbumin/Creatinine Ratio, Urine  3. Bunion of great toe of left foot Referral to podiatry for further evaluation - Ambulatory referral to Podiatry  4. Hammer toe of left foot Referral to podiatry - Ambulatory referral to Podiatry  5. Type 2 diabetes mellitus with stage 3a chronic kidney disease, without long-term current use of insulin (HCC) Check A1c minimize starches may need to be on medication await the findings of the creatinine first - Hemoglobin H3Z - Basic metabolic panel - Lipid panel - Hepatic Function Panel - Microalbumin/Creatinine Ratio,  Urine  6. Uncontrolled hypertension Very important to get this under better control start back on some of his medicines expect to adjust medicines and add medicines as we move forward patient needs to follow-up with [place] in 2 to 3 weeks follow-up sooner if any problems  7. Noncompliance I encouraged him greatly to be better about getting his medicines filled taking his medicines and his nieces are going to work with him on trying to help with this

## 2022-01-17 NOTE — Patient Instructions (Signed)

## 2022-01-19 LAB — HEPATIC FUNCTION PANEL
ALT: 11 IU/L (ref 0–44)
AST: 19 IU/L (ref 0–40)
Albumin: 4.3 g/dL (ref 3.9–4.9)
Alkaline Phosphatase: 118 IU/L (ref 44–121)
Bilirubin Total: 0.6 mg/dL (ref 0.0–1.2)
Bilirubin, Direct: 0.17 mg/dL (ref 0.00–0.40)
Total Protein: 7.8 g/dL (ref 6.0–8.5)

## 2022-01-19 LAB — BASIC METABOLIC PANEL
BUN/Creatinine Ratio: 10 (ref 10–24)
BUN: 17 mg/dL (ref 8–27)
CO2: 24 mmol/L (ref 20–29)
Calcium: 10.2 mg/dL (ref 8.6–10.2)
Chloride: 100 mmol/L (ref 96–106)
Creatinine, Ser: 1.7 mg/dL — ABNORMAL HIGH (ref 0.76–1.27)
Glucose: 87 mg/dL (ref 70–99)
Potassium: 4 mmol/L (ref 3.5–5.2)
Sodium: 141 mmol/L (ref 134–144)
eGFR: 43 mL/min/{1.73_m2} — ABNORMAL LOW (ref >59)

## 2022-01-19 LAB — LIPID PANEL
Chol/HDL Ratio: 4.8 ratio (ref 0.0–5.0)
Cholesterol, Total: 246 mg/dL — ABNORMAL HIGH (ref 100–199)
HDL: 51 mg/dL (ref >39)
LDL Chol Calc (NIH): 186 mg/dL — ABNORMAL HIGH (ref 0–99)
Triglycerides: 54 mg/dL (ref 0–149)
VLDL Cholesterol Cal: 9 mg/dL (ref 5–40)

## 2022-01-19 LAB — HEMOGLOBIN A1C
Est. average glucose Bld gHb Est-mCnc: 128 mg/dL
Hgb A1c MFr Bld: 6.1 % — ABNORMAL HIGH (ref 4.8–5.6)

## 2022-01-19 LAB — MICROALBUMIN / CREATININE URINE RATIO
Creatinine, Urine: 326.1 mg/dL
Microalb/Creat Ratio: 210 mg/g creat — ABNORMAL HIGH (ref 0–29)
Microalbumin, Urine: 683.9 ug/mL

## 2022-02-02 ENCOUNTER — Ambulatory Visit: Payer: Medicare Other | Admitting: Family Medicine

## 2022-02-06 ENCOUNTER — Ambulatory Visit (INDEPENDENT_AMBULATORY_CARE_PROVIDER_SITE_OTHER): Payer: Medicare Other | Admitting: Family Medicine

## 2022-02-06 ENCOUNTER — Encounter: Payer: Self-pay | Admitting: Family Medicine

## 2022-02-06 VITALS — BP 170/98 | HR 70 | Temp 98.4°F | Ht 71.0 in | Wt 167.0 lb

## 2022-02-06 DIAGNOSIS — I10 Essential (primary) hypertension: Secondary | ICD-10-CM

## 2022-02-06 DIAGNOSIS — E782 Mixed hyperlipidemia: Secondary | ICD-10-CM | POA: Diagnosis not present

## 2022-02-06 DIAGNOSIS — R7303 Prediabetes: Secondary | ICD-10-CM

## 2022-02-06 MED ORDER — DOXAZOSIN MESYLATE 4 MG PO TABS: 4.0000 mg | ORAL_TABLET | Freq: Every day | ORAL | 1 refills | Status: DC

## 2022-02-06 MED ORDER — ROSUVASTATIN CALCIUM 10 MG PO TABS: 10.0000 mg | ORAL_TABLET | Freq: Every day | ORAL | 1 refills | Status: DC

## 2022-02-06 MED ORDER — HYDROCHLOROTHIAZIDE 25 MG PO TABS: 25.0000 mg | ORAL_TABLET | Freq: Every day | ORAL | 1 refills | Status: DC

## 2022-02-06 NOTE — Patient Instructions (Signed)
Hi Daryl Perkins  Your blood pressure is doing better but it is still elevated  I will need for you to do some additional blood work plus we will also be sending you up for ultrasound  I would like to see you back in approximately 6 weeks if your blood pressure is still above where we want it to be then we may have to set you up with a specialist  Continue amlodipine 10 mg/day Increase the dose of HCTZ-new dose 25 mg a day Increase the dose of doxazosin-new dose 4 mg/day  Also rosuvastatin 10 mg/day for cholesterol  Please follow-up in September follow-up sooner if any problems take care-Dr. Nicki Reaper

## 2022-02-06 NOTE — Progress Notes (Signed)
   Subjective:    Patient ID: Daryl Perkins, male    DOB: 11/19/1950, 71 y.o.   MRN: [id number]  Hypertension This is a chronic problem. Treatments tried: amlodipine, doxazosin, hctz, potassium.  He still has a tendency to get up at 3 AM and go walking in the community I have told him that this is not a safe habit he to wait at least till early morning light he states he will try to wait till 5 AM on most days I encouraged him to wear a safety vest and have a phone with him We talked about healthy eating minimizing salt he is doing a better job of taking his blood pressure medicines    Review of Systems     Objective:   Physical Exam General-in no acute distress Eyes-no discharge Lungs-respiratory rate normal, CTA CV-no murmurs,RRR Extremities skin warm dry no edema Neuro grossly normal Behavior normal, alert        Assessment & Plan:   1. Mixed hyperlipidemia Restart cholesterol medicine healthy diet recommended - Aldosterone + renin activity w/ ratio - Metanephrines, plasma - US RENAL ART COMPLETE - doxazosin (CARDURA) 4 MG tablet; Take 1 tablet (4 mg total) by mouth daily.  Dispense: 90 tablet; Refill: 1 - hydrochlorothiazide (HYDRODIURIL) 25 MG tablet; Take 1 tablet (25 mg total) by mouth daily.  Dispense: 90 tablet; Refill: 1 - rosuvastatin (CRESTOR) 10 MG tablet; Take 1 tablet (10 mg total) by mouth daily.  Dispense: 90 tablet; Refill: 1  2. Prediabetes Diabetes in remission currently prediabetes watching diet closely.  Continue current measures - Aldosterone + renin activity w/ ratio - Metanephrines, plasma - US RENAL ART COMPLETE - doxazosin (CARDURA) 4 MG tablet; Take 1 tablet (4 mg total) by mouth daily.  Dispense: 90 tablet; Refill: 1 - hydrochlorothiazide (HYDRODIURIL) 25 MG tablet; Take 1 tablet (25 mg total) by mouth daily.  Dispense: 90 tablet; Refill: 1 - rosuvastatin (CRESTOR) 10 MG tablet; Take 1 tablet (10 mg total) by mouth daily.  Dispense: 90  tablet; Refill: 1  3. Uncontrolled hypertension Blood pressure uncontrolled additional testing bump up strength of diuretic as well as Cardura recheck again within 6 weeks consider referral to hypertension clinic if ongoing - Aldosterone + renin activity w/ ratio - Metanephrines, plasma - US RENAL ART COMPLETE - doxazosin (CARDURA) 4 MG tablet; Take 1 tablet (4 mg total) by mouth daily.  Dispense: 90 tablet; Refill: 1 - hydrochlorothiazide (HYDRODIURIL) 25 MG tablet; Take 1 tablet (25 mg total) by mouth daily.  Dispense: 90 tablet; Refill: 1 - rosuvastatin (CRESTOR) 10 MG tablet; Take 1 tablet (10 mg total) by mouth daily.  Dispense: 90 tablet; Refill: 1 Renal insufficiency chronic kidney disease related to uncontrolled hypertension Has a long history of noncompliance now has family members helping him out  He has CKD stage III associated with blood pressure issues.  We will consider Jardiance or Wilder Glade in a future visit but for now trying to work on blood pressure and identify potential secondary causes

## 2022-02-13 ENCOUNTER — Encounter: Payer: Self-pay | Admitting: Family Medicine

## 2022-02-13 LAB — ALDOSTERONE + RENIN ACTIVITY W/ RATIO
ALDOS/RENIN RATIO: 2.7 (ref 0.0–30.0)
ALDOSTERONE: 6.3 ng/dL (ref 0.0–30.0)
Renin: 2.351 ng/mL/hr (ref 0.167–5.380)

## 2022-02-13 LAB — METANEPHRINES, PLASMA
Metanephrine, Free: 60.2 pg/mL (ref 0.0–88.0)
Normetanephrine, Free: 100.6 pg/mL (ref 0.0–285.2)

## 2022-02-13 NOTE — Progress Notes (Signed)
Please mail to patient

## 2022-03-16 ENCOUNTER — Ambulatory Visit (INDEPENDENT_AMBULATORY_CARE_PROVIDER_SITE_OTHER): Payer: Medicare Other | Admitting: Family Medicine

## 2022-03-16 ENCOUNTER — Telehealth: Payer: Self-pay | Admitting: Family Medicine

## 2022-03-16 ENCOUNTER — Encounter: Payer: Self-pay | Admitting: Family Medicine

## 2022-03-16 VITALS — BP 168/94 | Wt 169.0 lb

## 2022-03-16 DIAGNOSIS — N1832 Chronic kidney disease, stage 3b: Secondary | ICD-10-CM

## 2022-03-16 DIAGNOSIS — E782 Mixed hyperlipidemia: Secondary | ICD-10-CM

## 2022-03-16 DIAGNOSIS — N289 Disorder of kidney and ureter, unspecified: Secondary | ICD-10-CM | POA: Diagnosis not present

## 2022-03-16 DIAGNOSIS — I10 Essential (primary) hypertension: Secondary | ICD-10-CM | POA: Diagnosis not present

## 2022-03-16 MED ORDER — INDAPAMIDE 2.5 MG PO TABS: ORAL_TABLET | ORAL | 1 refills | Status: DC

## 2022-03-16 MED ORDER — POTASSIUM CHLORIDE ER 10 MEQ PO TBCR: EXTENDED_RELEASE_TABLET | ORAL | 1 refills | Status: DC

## 2022-03-16 NOTE — Telephone Encounter (Signed)
[phone] 

## 2022-03-16 NOTE — Progress Notes (Addendum)
   Subjective:    Patient ID: Daryl Perkins, male    DOB: 01/15/1951, 71 y.o.   MRN: [id number]  HPI Pt arrives for follow up on blood pressure. No issues at this time. Taking meds as directed.  Patient with uncontrolled high blood pressure He states he is taking his meds Family states he is taking his meds Hard to verify Patient was instructed to bring his bottles with him on next visit He has a habit of getting outside of walking early in the morning He eats somewhat healthy we have discussed healthy eating multiple times  Review of Systems     Objective:   Physical Exam Lungs clear heart regular pulse normal extremities no edema       Assessment & Plan:   1. Uncontrolled hypertension We did discuss taking medication compliance healthy diet staying active rechecking blood pressure in the near future 4 weeks time here patient did not follow through with getting his ultrasound set up we will try to do this again at some point time we will refer him to the hypertension clinic but so far thorough work-up has not shown secondary issues this is more likely hypertensive renal vascular disease along with the significant level of social challenges - Basic metabolic panel - Calcium - Parathyroid hormone, intact (no Ca) - TSH - US Renal Artery Stenosis I change his blood pressure medicine from HCTZ to Akintemi and he will do blood work in approximately 7 to 10 days We also encouraged him to do the hydralazine 3 times daily doxazosin and amlodipine 2. Mixed hyperlipidemia Healthy diet regular activity recommended see above - Basic metabolic panel - Calcium - Parathyroid hormone, intact (no Ca) - TSH  3. Renal insufficiency See above - Basic metabolic panel - Calcium - Parathyroid hormone, intact (no Ca) - TSH  4. Stage 3b chronic kidney disease ([place]) See above Additional testing ordered Await the findings of this

## 2022-03-16 NOTE — Telephone Encounter (Signed)
Niece was connected with central scheduling and will schedule this appt for patient.

## 2022-03-16 NOTE — Telephone Encounter (Signed)
US Renal ordered in August and has not been set up. Per daughter Donella Stade; she will need to speak with her sister about times and dates that are good. Per Loma Sousa, no pre authorization is needed. Crystal will call or send my chart message letting us know and then when can

## 2022-03-20 ENCOUNTER — Other Ambulatory Visit: Payer: Self-pay | Admitting: Family Medicine

## 2022-03-20 DIAGNOSIS — I10 Essential (primary) hypertension: Secondary | ICD-10-CM

## 2022-03-24 ENCOUNTER — Ambulatory Visit (HOSPITAL_COMMUNITY): Admission: RE | Admit: 2022-03-24 | Payer: Medicare Other | Source: Ambulatory Visit

## 2022-03-29 ENCOUNTER — Ambulatory Visit: Payer: Medicare Other

## 2022-03-30 LAB — BASIC METABOLIC PANEL
BUN/Creatinine Ratio: 11 (ref 10–24)
BUN: 17 mg/dL (ref 8–27)
CO2: 27 mmol/L (ref 20–29)
Calcium: 9.8 mg/dL (ref 8.6–10.2)
Chloride: 97 mmol/L (ref 96–106)
Creatinine, Ser: 1.6 mg/dL — ABNORMAL HIGH (ref 0.76–1.27)
Glucose: 96 mg/dL (ref 70–99)
Potassium: 3.9 mmol/L (ref 3.5–5.2)
Sodium: 139 mmol/L (ref 134–144)
eGFR: 46 mL/min/{1.73_m2} — ABNORMAL LOW (ref >59)

## 2022-03-30 LAB — PARATHYROID HORMONE, INTACT (NO CA): PTH: 42 pg/mL (ref 15–65)

## 2022-03-30 LAB — TSH: TSH: 3.52 u[IU]/mL (ref 0.450–4.500)

## 2022-04-05 ENCOUNTER — Ambulatory Visit: Payer: Medicare Other | Attending: Cardiology

## 2022-04-05 DIAGNOSIS — I10 Essential (primary) hypertension: Secondary | ICD-10-CM | POA: Diagnosis not present

## 2022-04-06 ENCOUNTER — Other Ambulatory Visit: Payer: Self-pay | Admitting: *Deleted

## 2022-04-06 DIAGNOSIS — I10 Essential (primary) hypertension: Secondary | ICD-10-CM

## 2022-04-06 DIAGNOSIS — E782 Mixed hyperlipidemia: Secondary | ICD-10-CM

## 2022-04-06 DIAGNOSIS — R7303 Prediabetes: Secondary | ICD-10-CM

## 2022-04-06 MED ORDER — INDAPAMIDE 2.5 MG PO TABS: ORAL_TABLET | ORAL | 1 refills | Status: DC

## 2022-04-06 MED ORDER — POTASSIUM CHLORIDE ER 10 MEQ PO TBCR: EXTENDED_RELEASE_TABLET | ORAL | 1 refills | Status: DC

## 2022-04-11 ENCOUNTER — Telehealth: Payer: Self-pay | Admitting: *Deleted

## 2022-04-11 NOTE — Telephone Encounter (Signed)
Patient's niece called and stated that the patient told her on Sunday he has been seeing double in his right eye off and on for a few weeks. He is having no trouble with left eye. Patient has appt with Dr Nicki Reaper 04/17/22

## 2022-04-11 NOTE — Telephone Encounter (Signed)
Need to move appt to this week please-possibly weds?

## 2022-04-17 ENCOUNTER — Ambulatory Visit (INDEPENDENT_AMBULATORY_CARE_PROVIDER_SITE_OTHER): Payer: Medicare Other | Admitting: Family Medicine

## 2022-04-17 ENCOUNTER — Ambulatory Visit (HOSPITAL_COMMUNITY): Payer: Medicare Other

## 2022-04-17 VITALS — BP 199/106 | HR 69 | Temp 98.1°F | Ht 71.0 in | Wt 171.0 lb

## 2022-04-17 DIAGNOSIS — I10 Essential (primary) hypertension: Secondary | ICD-10-CM | POA: Diagnosis not present

## 2022-04-17 DIAGNOSIS — R9389 Abnormal findings on diagnostic imaging of other specified body structures: Secondary | ICD-10-CM

## 2022-04-17 DIAGNOSIS — H532 Diplopia: Secondary | ICD-10-CM | POA: Diagnosis not present

## 2022-04-17 MED ORDER — HYDRALAZINE HCL 50 MG PO TABS: 50.0000 mg | ORAL_TABLET | Freq: Three times a day (TID) | ORAL | 5 refills | Status: DC

## 2022-04-17 NOTE — Progress Notes (Signed)
   Subjective:    Patient ID: Daryl Perkins, male    DOB: March 31, 1951, 71 y.o.   MRN: [id number]  HPI --Follow up for hypertension  Currently taking all prescribed medications , have not received hydralazine refills --Patient c/o vision changes in the right eye - double vision and runny eye x 1 month Patient relates having some double vision in the right eye he states he seems to see 2 of everything he denies any headaches denies unilateral numbness weakness denies hematuria denies chest pressure tightness or shortness of breath  Review of Systems     Objective:   Physical Exam  Does not appear to be in any distress no unilateral weakness noted lungs are clear hearts regular Strength equal bilateral Patient has difficult time with his right eye gazing to the right and looking upward      Assessment & Plan:  Poorly controlled high blood pressure Compliance is an issue We will go ahead and consult Dr. Oval Linsey for further evaluation Recheck the patient within 2 weeks  Double vision concerning for the possibility of a stroke Consultation with ophthalmology to look at his eyes May need neurology consultation as well but next step would be MRI MRI ordered  Follow-up in 2 weeks if not doing better add spironolactone

## 2022-04-18 ENCOUNTER — Ambulatory Visit (HOSPITAL_COMMUNITY)
Admission: RE | Admit: 2022-04-18 | Discharge: 2022-04-18 | Disposition: A | Discharge status: Home / Self Care | Payer: Medicare Other | Source: Ambulatory Visit | Attending: Family Medicine | Admitting: Family Medicine

## 2022-04-18 DIAGNOSIS — H532 Diplopia: Secondary | ICD-10-CM | POA: Insufficient documentation

## 2022-04-20 ENCOUNTER — Telehealth: Payer: Self-pay | Admitting: Family Medicine

## 2022-04-20 NOTE — Telephone Encounter (Signed)
Front Patient is coming Tuesday at 11 AM to be worked in with me please put him in the 1140 slot if possible thank you

## 2022-04-21 NOTE — Addendum Note (Signed)
Addended by: Dairl Ponder on: 04/21/2022 04:47 PM   Modules accepted: Orders

## 2022-04-21 NOTE — Telephone Encounter (Signed)
Please advise. Thank you

## 2022-04-25 ENCOUNTER — Ambulatory Visit (INDEPENDENT_AMBULATORY_CARE_PROVIDER_SITE_OTHER): Payer: Medicare Other | Admitting: Family Medicine

## 2022-04-25 ENCOUNTER — Encounter: Payer: Self-pay | Admitting: Family Medicine

## 2022-04-25 VITALS — BP 166/90 | Wt 172.0 lb

## 2022-04-25 DIAGNOSIS — I10 Essential (primary) hypertension: Secondary | ICD-10-CM | POA: Diagnosis not present

## 2022-04-25 DIAGNOSIS — E782 Mixed hyperlipidemia: Secondary | ICD-10-CM | POA: Diagnosis not present

## 2022-04-25 DIAGNOSIS — Z8673 Personal history of transient ischemic attack (TIA), and cerebral infarction without residual deficits: Secondary | ICD-10-CM | POA: Diagnosis not present

## 2022-04-25 MED ORDER — SPIRONOLACTONE 25 MG PO TABS: ORAL_TABLET | ORAL | 1 refills | Status: DC

## 2022-04-25 NOTE — Patient Instructions (Signed)
Stop potassium  Start spironolactone 25 mg take 1/2 each day Do lab test in 1 week Recheck here nov 2 Thursday at 3:40 pm

## 2022-04-25 NOTE — Progress Notes (Signed)
   Subjective:    Patient ID: Daryl Perkins, male    DOB: January 24, 1951, 71 y.o.   MRN: [id number]  HPI Pt arrives for follow up. Pt niece states that his vision is about the same. HTN specialist can not see him until 05/18/22. Pt has began ASA 81 mg daily as directed.    Review of Systems     Objective:   Physical Exam Lungs clear heart regular pulse normal blood pressure elevated on both readings       Assessment & Plan:   Very for call to control blood pressure I reviewed over the medicines with the family today Continue current medications Add low-dose spironolactone 12.5 mg daily Check metabolic 7 in 1 week Follow-up patient within 10 to 14 days  Evidence of stroke recommend 81 mg aspirin daily Patient will see neurology in the near future  Lipidemia continue statin  Diabetes decent control with diet and activity

## 2022-05-02 ENCOUNTER — Ambulatory Visit: Payer: Medicare Other | Admitting: Family Medicine

## 2022-05-03 LAB — BASIC METABOLIC PANEL
BUN/Creatinine Ratio: 11 (ref 10–24)
BUN: 19 mg/dL (ref 8–27)
CO2: 26 mmol/L (ref 20–29)
Calcium: 9.4 mg/dL (ref 8.6–10.2)
Chloride: 93 mmol/L — ABNORMAL LOW (ref 96–106)
Creatinine, Ser: 1.77 mg/dL — ABNORMAL HIGH (ref 0.76–1.27)
Glucose: 175 mg/dL — ABNORMAL HIGH (ref 70–99)
Potassium: 3.7 mmol/L (ref 3.5–5.2)
Sodium: 136 mmol/L (ref 134–144)
eGFR: 41 mL/min/{1.73_m2} — ABNORMAL LOW (ref >59)

## 2022-05-04 ENCOUNTER — Ambulatory Visit (INDEPENDENT_AMBULATORY_CARE_PROVIDER_SITE_OTHER): Payer: Medicare Other | Admitting: Family Medicine

## 2022-05-04 VITALS — BP 154/78 | HR 72 | Temp 98.4°F | Ht 71.0 in | Wt 172.0 lb

## 2022-05-04 DIAGNOSIS — N1832 Chronic kidney disease, stage 3b: Secondary | ICD-10-CM

## 2022-05-04 DIAGNOSIS — Z23 Encounter for immunization: Secondary | ICD-10-CM

## 2022-05-04 DIAGNOSIS — I10 Essential (primary) hypertension: Secondary | ICD-10-CM

## 2022-05-04 MED ORDER — HYDRALAZINE HCL 50 MG PO TABS: ORAL_TABLET | ORAL | 5 refills | Status: DC

## 2022-05-04 NOTE — Progress Notes (Signed)
   Subjective:    Patient ID: Daryl Perkins, male    DOB: [birth year], 71 y.o.   MRN: [id number]  Hypertension This is a chronic problem.  Follow up reports taking medications  Patient does take his medicines Does do walking Tries minimize salt Denies any headaches chest pain shortness of breath   Review of Systems     Objective:   Physical Exam General-in no acute distress Eyes-no discharge Lungs-respiratory rate normal, CTA CV-no murmurs,RRR Extremities skin warm dry no edema Neuro grossly normal Behavior normal, alert        Assessment & Plan:  Recent stroke Neurology consult pending Patient has appointment at hypertensive clinic coming up within 2 weeks Bump up dose of hydralazine utilize 50 mg in the morning 50 mg midday 100 mg in the evening Referral to nephrology for chronic kidney disease Follow-up here in 2 months for recheck

## 2022-05-05 NOTE — Progress Notes (Signed)
Referral has been placed. 

## 2022-05-18 ENCOUNTER — Ambulatory Visit (HOSPITAL_BASED_OUTPATIENT_CLINIC_OR_DEPARTMENT_OTHER): Payer: Medicare Other | Admitting: Family

## 2022-05-29 ENCOUNTER — Telehealth: Payer: Self-pay | Admitting: Family Medicine

## 2022-05-29 NOTE — Telephone Encounter (Signed)
Left message for patient to call back and schedule Medicare Annual Wellness Visit (AWV) in office.   If unable to come into the office for AWV,  please offer to do virtually or by telephone.  Last AWV: 12/09/2019  Please schedule at anytime with [facility]Nurse Health Advisor.  30 minute appointment for Virtual or phone  45 minute appointment for in office or Initial virtual/phone  Any questions, please contact me at [phone]

## 2022-05-30 ENCOUNTER — Ambulatory Visit: Payer: Medicare Other | Admitting: Neurology

## 2022-06-08 ENCOUNTER — Encounter (HOSPITAL_BASED_OUTPATIENT_CLINIC_OR_DEPARTMENT_OTHER): Payer: Self-pay | Admitting: Family

## 2022-06-08 ENCOUNTER — Ambulatory Visit (INDEPENDENT_AMBULATORY_CARE_PROVIDER_SITE_OTHER): Payer: Medicare Other | Admitting: Family

## 2022-06-08 ENCOUNTER — Other Ambulatory Visit (INDEPENDENT_AMBULATORY_CARE_PROVIDER_SITE_OTHER): Payer: Medicare Other

## 2022-06-08 VITALS — BP 166/73 | HR 85 | Ht 71.0 in | Wt 169.0 lb

## 2022-06-08 DIAGNOSIS — I493 Ventricular premature depolarization: Secondary | ICD-10-CM | POA: Diagnosis not present

## 2022-06-08 DIAGNOSIS — Z8673 Personal history of transient ischemic attack (TIA), and cerebral infarction without residual deficits: Secondary | ICD-10-CM

## 2022-06-08 DIAGNOSIS — Z8249 Family history of ischemic heart disease and other diseases of the circulatory system: Secondary | ICD-10-CM

## 2022-06-08 DIAGNOSIS — R072 Precordial pain: Secondary | ICD-10-CM

## 2022-06-08 DIAGNOSIS — I10 Essential (primary) hypertension: Secondary | ICD-10-CM

## 2022-06-08 DIAGNOSIS — R9431 Abnormal electrocardiogram [ECG] [EKG]: Secondary | ICD-10-CM

## 2022-06-08 DIAGNOSIS — Z006 Encounter for examination for normal comparison and control in clinical research program: Secondary | ICD-10-CM

## 2022-06-08 MED ORDER — METOPROLOL TARTRATE 100 MG PO TABS: ORAL_TABLET | ORAL | 0 refills | Status: DC

## 2022-06-08 MED ORDER — HYDRALAZINE HCL 50 MG PO TABS: ORAL_TABLET | ORAL | 3 refills | Status: DC

## 2022-06-08 NOTE — Patient Instructions (Addendum)
Medication Instructions:  Your physician has recommended you make the following change in your medication:   Change: Hydralazine    - '100mg'$  (2 tablets) in the morning   - '50mg'$  (1 tablet) midday   -'100mg'$  (2 tablets) in the evening  Labwork: Your physician recommends that you return for lab work today- CMP, Direct Lipid Panel     Testing/Procedures: Your physician has recommended that you wear a Zio monitor.   This monitor is a medical device that records the heart's electrical activity. Doctors most often use these monitors to diagnose arrhythmias. Arrhythmias are problems with the speed or rhythm of the heartbeat. The monitor is a small device applied to your chest. You can wear one while you do your normal daily activities. While wearing this monitor if you have any symptoms to push the button and record what you felt. Once you have worn this monitor for the period of time provider prescribed (Usually 14 days), you will return the monitor device in the postage paid box. Once it is returned they will download the data collected and provide [place] with a report which the provider will then review and we will call you with those results. Important tips:  Avoid showering during the first 24 hours of wearing the monitor. Avoid excessive sweating to help maximize wear time. Do not submerge the device, no hot tubs, and no swimming pools. Keep any lotions or oils away from the patch. After 24 hours you may shower with the patch on. Take brief showers with your back facing the shower head.  Do not remove patch once it has been placed because that will interrupt data and decrease adhesive wear time. Push the button when you have any symptoms and write down what you were feeling. Once you have completed wearing your monitor, remove and place into box which has postage paid and place in your outgoing mailbox.  If for some reason you have misplaced your box then call our office and we can provide another  box and/or mail it off for you.     Your cardiac CT will be scheduled at one of the below locations:   [facility] [address] [place], [place] [address] [phone]  If scheduled at [facility], please arrive at the [facility] and Children's Entrance (Entrance C2) of [facility] 30 minutes prior to test start time. You can use the FREE valet parking offered at entrance C (encouraged to control the heart rate for the test)  Proceed to the [facility] Radiology Department (first floor) to check-in and test prep.  All radiology patients and guests should use entrance C2 at [facility], accessed from [facility], even though the hospital's physical address listed is [address]    Please follow these instructions carefully (unless otherwise directed):  Hold all erectile dysfunction medications at least 3 days (72 hrs) prior to test. (Ie viagra, cialis, sildenafil, tadalafil, etc) We will administer nitroglycerin during this exam.   On the Night Before the Test: Be sure to Drink plenty of water. Do not consume any caffeinated/decaffeinated beverages or chocolate 12 hours prior to your test. Do not take any antihistamines 12 hours prior to your test.  On the Day of the Test: Drink plenty of water until 1 hour prior to the test. Do not eat any food 1 hour prior to test. You may take your regular medications prior to the test.  Take metoprolol (Lopressor) '100mg'$   two  hours prior to test.      After the Test: Drink plenty of water. After receiving IV contrast, you may experience a mild flushed feeling. This is normal. On occasion, you may experience a mild rash up to 24 hours after the test. This is not dangerous. If this occurs, you can take Benadryl 25 mg and increase your fluid intake. If you experience trouble breathing, this can be serious. If it is severe call 911 IMMEDIATELY. If it is mild, please call our office. If  you take any of these medications: Glipizide/Metformin, Avandament, Glucavance, please do not take 48 hours after completing test unless otherwise instructed.  We will call to schedule your test 2-4 weeks out understanding that some insurance companies will need an authorization prior to the service being performed.   For non-scheduling related questions, please contact the cardiac imaging nurse navigator should you have any questions/concerns: Marchia Bond, Cardiac Imaging Nurse Navigator Gordy Clement, Cardiac Imaging Nurse Navigator [facility] Direct Office Dial: [phone]   For scheduling needs, including cancellations and rescheduling, please call [place], [phone].    Follow-Up: 1 MONTH FOLLOW UP WITH CAITLIN WALKER, NP ON JANUARY 18TH AT 8:50AM  Special Instructions:  DASH Eating Plan DASH stands for Dietary Approaches to Stop Hypertension. The DASH eating plan is a healthy eating plan that has been shown to: Reduce high blood pressure (hypertension). Reduce your risk for type 2 diabetes, heart disease, and stroke. Help with weight loss. What are tips for following this plan? Reading food labels Check food labels for the amount of salt (sodium) per serving. Choose foods with less than 5 percent of the Daily Value of sodium. Generally, foods with less than 300 milligrams (mg) of sodium per serving fit into this eating plan. To find whole grains, look for the word "whole" as the first word in the ingredient list. Shopping Buy products labeled as "low-sodium" or "no salt added." Buy fresh foods. Avoid canned foods and pre-made or frozen meals. Cooking Avoid adding salt when cooking. Use salt-free seasonings or herbs instead of table salt or sea salt. Check with your health care provider or pharmacist before using salt substitutes. Do not fry foods. Cook foods using healthy methods such as baking, boiling, grilling, roasting, and broiling  instead. Cook with heart-healthy oils, such as olive, canola, avocado, soybean, or sunflower oil. Meal planning  Eat a balanced diet that includes: 4 or more servings of fruits and 4 or more servings of vegetables each day. Try to fill one-half of your plate with fruits and vegetables. 6-8 servings of whole grains each day. Less than 6 oz (170 g) of lean meat, poultry, or fish each day. A 3-oz (85-g) serving of meat is about the same size as a deck of cards. One egg equals 1 oz (28 g). 2-3 servings of low-fat dairy each day. One serving is 1 cup (237 mL). 1 serving of nuts, seeds, or beans 5 times each week. 2-3 servings of heart-healthy fats. Healthy fats called omega-3 fatty acids are found in foods such as walnuts, flaxseeds, fortified milks, and eggs. These fats are also found in cold-water fish, such as sardines, salmon, and mackerel. Limit how much you eat of: Canned or prepackaged foods. Food that is high in trans fat, such as some fried foods. Food that is high in saturated fat, such as fatty meat. Desserts and other sweets, sugary drinks, and other foods with added sugar. Full-fat dairy products. Do not salt foods before  eating. Do not eat more than 4 egg yolks a week. Try to eat at least 2 vegetarian meals a week. Eat more home-cooked food and less restaurant, buffet, and fast food. Lifestyle When eating at a restaurant, ask that your food be prepared with less salt or no salt, if possible. If you drink alcohol: Limit how much you use to: 0-1 drink a day for women who are not pregnant. 0-2 drinks a day for men. Be aware of how much alcohol is in your drink. In the [place] one drink equals one 12 oz bottle of beer (355 mL), one 5 oz glass of wine (148 mL), or one 1 oz glass of hard liquor (44 mL). General information Avoid eating more than 2,300 mg of salt a day. If you have hypertension, you may need to reduce your sodium intake to 1,500 mg a day. Work with your health care  provider to maintain a healthy body weight or to lose weight. Ask what an ideal weight is for you. Get at least 30 minutes of exercise that causes your heart to beat faster (aerobic exercise) most days of the week. Activities may include walking, swimming, or biking. Work with your health care provider or dietitian to adjust your eating plan to your individual calorie needs. What foods should I eat? Fruits All fresh, dried, or frozen fruit. Canned fruit in natural juice (without added sugar). Vegetables Fresh or frozen vegetables (raw, steamed, roasted, or grilled). Low-sodium or reduced-sodium tomato and vegetable juice. Low-sodium or reduced-sodium tomato sauce and tomato paste. Low-sodium or reduced-sodium canned vegetables. Grains Whole-grain or whole-wheat bread. Whole-grain or whole-wheat pasta. Brown rice. [place]. Bulgur. Whole-grain and low-sodium cereals. Pita bread. Low-fat, low-sodium crackers. Whole-wheat flour tortillas. Meats and other proteins Skinless chicken or [place]. Ground chicken or [place]. Pork with fat trimmed off. Fish and seafood. Egg whites. Dried beans, peas, or lentils. Unsalted nuts, nut butters, and seeds. Unsalted canned beans. Lean cuts of beef with fat trimmed off. Low-sodium, lean precooked or cured meat, such as sausages or meat loaves. Dairy Low-fat (1%) or fat-free (skim) milk. Reduced-fat, low-fat, or fat-free cheeses. Nonfat, low-sodium ricotta or cottage cheese. Low-fat or nonfat yogurt. Low-fat, low-sodium cheese. Fats and oils Soft margarine without trans fats. Vegetable oil. Reduced-fat, low-fat, or light mayonnaise and salad dressings (reduced-sodium). Canola, safflower, olive, avocado, soybean, and sunflower oils. Avocado. Seasonings and condiments Herbs. Spices. Seasoning mixes without salt. Other foods Unsalted popcorn and pretzels. Fat-free sweets. The items listed above may not be a complete list of foods and beverages you can eat. Contact  a dietitian for more information. What foods should I avoid? Fruits Canned fruit in a light or heavy syrup. Fried fruit. Fruit in cream or butter sauce. Vegetables Creamed or fried vegetables. Vegetables in a cheese sauce. Regular canned vegetables (not low-sodium or reduced-sodium). Regular canned tomato sauce and paste (not low-sodium or reduced-sodium). Regular tomato and vegetable juice (not low-sodium or reduced-sodium). Angie Fava. Olives. Grains Baked goods made with fat, such as croissants, muffins, or some breads. Dry pasta or rice meal packs. Meats and other proteins Fatty cuts of meat. Ribs. Fried meat. Berniece Salines. Bologna, salami, and other precooked or cured meats, such as sausages or meat loaves. Fat from the back of a pig (fatback). Bratwurst. Salted nuts and seeds. Canned beans with added salt. Canned or smoked fish. Whole eggs or egg yolks. Chicken or [place] with skin. Dairy Whole or 2% milk, cream, and half-and-half. Whole or full-fat cream cheese. Whole-fat or sweetened yogurt.  Full-fat cheese. Nondairy creamers. Whipped toppings. Processed cheese and cheese spreads. Fats and oils Butter. Stick margarine. Lard. Shortening. Ghee. Bacon fat. Tropical oils, such as coconut, palm kernel, or palm oil. Seasonings and condiments Onion salt, garlic salt, seasoned salt, table salt, and sea salt. Worcestershire sauce. Tartar sauce. Barbecue sauce. Teriyaki sauce. Soy sauce, including reduced-sodium. Steak sauce. Canned and packaged gravies. Fish sauce. Oyster sauce. Cocktail sauce. Store-bought horseradish. Ketchup. Mustard. Meat flavorings and tenderizers. Bouillon cubes. Hot sauces. Pre-made or packaged marinades. Pre-made or packaged taco seasonings. Relishes. Regular salad dressings. Other foods Salted popcorn and pretzels. The items listed above may not be a complete list of foods and beverages you should avoid. Contact a dietitian for more information. Where to find more  information National Heart, Lung, and Blood Institute: https://Camillia Marcy-eaton.com/ American Heart Association: www.heart.org Academy of Nutrition and Dietetics: www.eatright.[place]: www.kidney.org Summary The DASH eating plan is a healthy eating plan that has been shown to reduce high blood pressure (hypertension). It may also reduce your risk for type 2 diabetes, heart disease, and stroke. When on the DASH eating plan, aim to eat more fresh fruits and vegetables, whole grains, lean proteins, low-fat dairy, and heart-healthy fats. With the DASH eating plan, you should limit salt (sodium) intake to 2,300 mg a day. If you have hypertension, you may need to reduce your sodium intake to 1,500 mg a day. Work with your health care provider or dietitian to adjust your eating plan to your individual calorie needs. This information is not intended to replace advice given to you by your health care provider. Make sure you discuss any questions you have with your health care provider. Document Revised: 05/23/2019 Document Reviewed: 05/23/2019 Elsevier Patient Education  [place].

## 2022-06-08 NOTE — Progress Notes (Signed)
[facility]pert[facility] Initial Assessment:    Date:  06/12/2022   ID:  Daryl Perkins, DOB 17-Nov-1950, MRN [id number]  PCP:  Kathyrn Drown, MD  Cardiologist:  None  Nephrologist:  Referring MD: Kathyrn Drown, MD   CC: Hypertension  History of Present Illness:    Daryl Perkins is a 71 y.o. male with a hx of HTN, CVA, CKD, aortic atherosclerosis, syncope here to establish care in the [facility]. Family history of heart disease, hypertension.    Prior CT 01/2017 with aortic atherosclerosis. Prior 04/05/22 renal duplex with no stenosis and minimal iliac artery aneurysm.  He presents today with his niece. Daryl Perkins was diagnosed with hypertension in his 56s. It has been fluctuating.  Niece helps him with pill pack to promote compliance. Blood pressure not checked routinely at home. For exercise he walks regularly up to hours at a time. . he eats at home and outside of the home and does follow low sodium diet. Notes no chest pain, pressure, tightness. Notes no shortness of breath at rest nor dyspnea on exertoon. Niece does share with me that in October he had an episode of syncope and a fall while waiting on his girlfriend at the hairdresser.    Previous antihypertensives: Losartan -  HCTZ -    Past Medical History:  Diagnosis Date   Diabetes mellitus without complication ([place])    HOH (hard of hearing)    Hypertension    Poor dental hygiene    Prediabetes 12/09/2019    Past Surgical History:  Procedure Laterality Date   BACK SURGERY     1987 and 1989. lumbar disc   COLONOSCOPY N/A 06/09/2020   Procedure: COLONOSCOPY;  Surgeon: Daneil Dolin, MD;  Location: [facility];  Service: Endoscopy;  Laterality: N/A;  12:00   INGUINAL HERNIA REPAIR Right 09/09/2012   Procedure: HERNIA REPAIR INGUINAL ADULT;  Surgeon: Jamesetta So, MD;  Location: [facility];  Service: General;  Laterality: Right;   INSERTION OF MESH Right 09/09/2012   Procedure: INSERTION  OF MESH;  Surgeon: Jamesetta So, MD;  Location: [facility];  Service: General;  Laterality: Right;   POLYPECTOMY  06/09/2020   Procedure: POLYPECTOMY;  Surgeon: Daneil Dolin, MD;  Location: [facility];  Service: Endoscopy;;  rectal, hepatic flexure    Current Medications: Current Meds  Medication Sig   amLODipine (NORVASC) 10 MG tablet Take 1 tablet (10 mg total) by mouth daily.   aspirin EC 81 MG tablet Take 81 mg by mouth daily. Swallow whole.   doxazosin (CARDURA) 4 MG tablet Take 1 tablet (4 mg total) by mouth daily.   indapamide (LOZOL) 2.5 MG tablet Take one tablet po daily   metoprolol tartrate (LOPRESSOR) 100 MG tablet PLEASE TAKE 2 HOURS PRIOR TO CARDIAC CT   [DISCONTINUED] hydrALAZINE (APRESOLINE) 50 MG tablet 1 qam 1qmid day and 2 qhs   [DISCONTINUED] rosuvastatin (CRESTOR) 10 MG tablet Take 1 tablet (10 mg total) by mouth daily.   [DISCONTINUED] spironolactone (ALDACTONE) 25 MG tablet 1/2 qam     Allergies:   Patient has no known allergies.   Social History   Socioeconomic History   Marital status: Divorced    Spouse name: Not on file   Number of children: Not on file   Years of education: Not on file   Highest education level: Not on file  Occupational History   Not on file  Tobacco Use   Smoking  status: Never   Smokeless tobacco: Never  Substance and Sexual Activity   Alcohol use: No   Drug use: No   Sexual activity: Yes    Birth control/protection: None  Other Topics Concern   Not on file  Social History Narrative   Not on file   Social Determinants of Health   Financial Resource Strain: Not on file  Food Insecurity: No Food Insecurity (06/08/2022)   Hunger Vital Sign    Worried About Running Out of Food in the Last Year: Never true    Ran Out of Food in the Last Year: Never true  Transportation Needs: No Transportation Needs (06/08/2022)   [place] - Hydrologist (Medical): No    Lack of Transportation (Non-Medical): No   Physical Activity: Sufficiently Active (06/08/2022)   Exercise Vital Sign    Days of Exercise per Week: 7 days    Minutes of Exercise per Session: 150+ min  Stress: Not on file  Social Connections: Not on file     Family History: The patient's family history includes Heart attack in his brother; Hypertension in his niece and sister.  ROS:   Please see the history of present illness.     All other systems reviewed and are negative.  EKGs/Labs/Other Studies Reviewed:    EKG:  EKG is ordered today.  The ekg ordered today demonstrates NSR 85 bpm with PVC and TWI V4-V6.   Recent Labs: 03/29/2022: TSH 3.520 06/08/2022: ALT 11; BUN 24; Creatinine, Ser 2.14; Potassium 3.8; Sodium 137   Recent Lipid Panel    Component Value Date/Time   CHOL 246 (H) 01/17/2022 1042   TRIG 54 01/17/2022 1042   HDL 51 01/17/2022 1042   CHOLHDL 4.8 01/17/2022 1042   CHOLHDL 4.5 08/08/2013 1246   VLDL 24 08/08/2013 1246   LDLCALC 186 (H) 01/17/2022 1042   LDLDIRECT 138 (H) 06/08/2022 1456    Physical Exam:   VS:  BP (!) 166/73 Comment: right arm  Pulse 85   Ht '5\' 11"'$  (1.803 m)   Wt 169 lb (76.7 kg)   BMI 23.57 kg/m  , BMI Body mass index is 23.57 kg/m. GENERAL:  Well appearing HEENT: Pupils equal round and reactive, fundi not visualized, oral mucosa unremarkable NECK:  No jugular venous distention, waveform within normal limits, carotid upstroke brisk and symmetric, no bruits, no thyromegaly LYMPHATICS:  No cervical adenopathy LUNGS:  Clear to auscultation bilaterally HEART:  RRR.  PMI not displaced or sustained,S1 and S2 within normal limits, no S3, no S4, no clicks, no rubs, no murmurs ABD:  Flat, positive bowel sounds normal in frequency in pitch, no bruits, no rebound, no guarding, no midline pulsatile mass, no hepatomegaly, no splenomegaly EXT:  2 plus pulses throughout, no edema, no cyanosis no clubbing SKIN:  No rashes no nodules NEURO:  Cranial nerves II through XII grossly intact, motor  grossly intact throughout PSYCH:  Cognitively intact, oriented to person place and time   ASSESSMENT/PLAN:    HTN - BP not at goal of <130/80. Increase Hydralazine to '100mg'$  AM, '50mg'$  afternoon, '100mg'$  PM. Continue amlodipine '10mg'$  QD, Doxazosin '4mg'$  QD, Indapamide 2.'5mg'$ , Spironolactone '25mg'$  QD. Enrolled in [facility] HTN research study. Home BP cuff provided.  Secondary workup: No snoring suggestive of OSA. 03/2022 TSH 3.5. 04/05/22 no renal artery stenosis and minimal iliac artery aneurysm.   PVC / Syncope  - PVC noted on EKG today. Reports prior episode of syncope in October. 14 day ZIO placed  in clinic.   Abnormal EKG - EKG today with new TWI lateral leads. No chest pain, dyspnea. Plan for cardiac CTA for ischemic eval.   Hx of CVA - Continue aspirin, Rosuvastatin.   Aortic atherosclerosis / HLD, LDL goal <70 - CMP, direct LDL today. If LDL not at goal, increase rosuvastatin.   CKD - Careful titration of diuretic and antihypertensive.  BMP today.   Screening for Secondary Hypertension:     Relevant Labs/Studies:    Latest Ref Rng & Units 06/08/2022    2:56 PM 05/02/2022   12:21 PM 03/29/2022    8:47 AM  Basic Labs  Sodium 134 - 144 mmol/L 137  136  139   Potassium 3.5 - 5.2 mmol/L 3.8  3.7  3.9   Creatinine 0.76 - 1.27 mg/dL 2.14  1.77  1.60        Latest Ref Rng & Units 03/29/2022    8:47 AM  Thyroid   TSH 0.450 - 4.500 uIU/mL 3.520        Latest Ref Rng & Units 02/06/2022   10:01 AM  Renin/Aldosterone   Aldosterone 0.0 - 30.0 ng/dL 6.3   Renin 0.167 - 5.380 ng/mL/hr 2.351   Aldos/Renin Ratio 0.0 - 30.0 2.7        Latest Ref Rng & Units 02/06/2022   10:01 AM  Metanephrines/Catecholamines   Metanephrines 0.0 - 88.0 pg/mL 60.2   Normetanephrines  0.0 - 285.2 pg/mL 100.6           06/12/2022   10:43 AM  Renovascular   Renal Artery [place] Completed Yes        he consents to be monitored in our remote patient monitoring program through [facility].  he will track his blood pressure  twice daily and understands that these trends will help [place] to adjust his medications as needed prior to his next appointment.  he is not interested in enrolling in the PREP exercise and nutrition program through the [facility].     Disposition:    FU with MD/PharmD in 1 month    Medication Adjustments/Labs and Tests Ordered: Current medicines are reviewed at length with the patient today.  Concerns regarding medicines are outlined above.  Orders Placed This Encounter  Procedures   CT CORONARY MORPH W/CTA COR W/SCORE W/CA W/CM &/OR WO/CM   LDL cholesterol, direct   Comprehensive metabolic panel   Cantril's Ladder Assessment   LONG TERM MONITOR (3-14 DAYS)   EKG 12-Lead   Meds ordered this encounter  Medications   DISCONTD: hydrALAZINE (APRESOLINE) 50 MG tablet    Sig: 2 tablets in the am, 1 tablet midday, 2 tablets in the evening    Dispense:  450 tablet    Refill:  3    New instructions   metoprolol tartrate (LOPRESSOR) 100 MG tablet    Sig: PLEASE TAKE 2 HOURS PRIOR TO CARDIAC CT    Dispense:  1 tablet    Refill:  0     Signed, Loel Dubonnet, NP  06/12/2022 9:24 PM    [facility]

## 2022-06-08 NOTE — Research (Signed)
  Subject Name: Daryl Perkins met inclusion and exclusion criteria for the Virtual Care and Social Determinant Interventions for the management of hypertension trial.  The informed consent form, study requirements and expectations were reviewed with the subject by Dr. Oval Linsey and myself. The subject was given the opportunity to read the consent and ask questions. The subject verbalized understanding of the trial requirements.  All questions were addressed prior to the signing of the consent form. The subject agreed to participate in the trial and signed the informed consent. The informed consent was obtained prior to performance of any protocol-specific procedures for the subject.  A copy of the signed informed consent was given to the subject and a copy was placed in the subject's medical record.  Daryl Perkins was randomized to Group 1.

## 2022-06-09 ENCOUNTER — Telehealth (HOSPITAL_BASED_OUTPATIENT_CLINIC_OR_DEPARTMENT_OTHER): Payer: Self-pay

## 2022-06-09 DIAGNOSIS — I10 Essential (primary) hypertension: Secondary | ICD-10-CM

## 2022-06-09 DIAGNOSIS — E782 Mixed hyperlipidemia: Secondary | ICD-10-CM

## 2022-06-09 DIAGNOSIS — R7303 Prediabetes: Secondary | ICD-10-CM

## 2022-06-09 LAB — COMPREHENSIVE METABOLIC PANEL
ALT: 11 IU/L (ref 0–44)
AST: 25 IU/L (ref 0–40)
Albumin/Globulin Ratio: 1.2 (ref 1.2–2.2)
Albumin: 4.2 g/dL (ref 3.9–4.9)
Alkaline Phosphatase: 116 IU/L (ref 44–121)
BUN/Creatinine Ratio: 11 (ref 10–24)
BUN: 24 mg/dL (ref 8–27)
Bilirubin Total: 0.5 mg/dL (ref 0.0–1.2)
CO2: 21 mmol/L (ref 20–29)
Calcium: 9.8 mg/dL (ref 8.6–10.2)
Chloride: 99 mmol/L (ref 96–106)
Creatinine, Ser: 2.14 mg/dL — ABNORMAL HIGH (ref 0.76–1.27)
Globulin, Total: 3.6 g/dL (ref 1.5–4.5)
Glucose: 143 mg/dL — ABNORMAL HIGH (ref 70–99)
Potassium: 3.8 mmol/L (ref 3.5–5.2)
Sodium: 137 mmol/L (ref 134–144)
Total Protein: 7.8 g/dL (ref 6.0–8.5)
eGFR: 32 mL/min/{1.73_m2} — ABNORMAL LOW (ref >59)

## 2022-06-09 LAB — LDL CHOLESTEROL, DIRECT: LDL Direct: 138 mg/dL — ABNORMAL HIGH (ref 0–99)

## 2022-06-09 NOTE — Telephone Encounter (Addendum)
Attempted to call patient to review the following recommendations, both numbers attempted, no answer, or VM . Upon contact with patient orders will need to be updated.    ----- Message from Loel Dubonnet, NP sent at 06/09/2022  9:05 AM EST ----- Kidney function worse than previous. Needs to increase fluid intake. Normal liver.  LDL (bad cholesterol) of 138 which is elevated. Increase Rosuvastatin to '20mg'$  daily.   For management of BP while protecting kidney function:   Continue Amlodipine '10mg'$  daily, Doxazosin '4mg'$  daily, Indapamide 2.'5mg'$ .   Stop Spironolactone.    Adjust Hydralazine to '100mg'$  TID (send updated Rx for '100mg'$  tablet).   Repeat BMP in 1 week.  We will need to hold off on cardiac CT at this time until his kidney function improves. Please ensure he has a soon appointment with nephrology.

## 2022-06-12 ENCOUNTER — Telehealth (HOSPITAL_BASED_OUTPATIENT_CLINIC_OR_DEPARTMENT_OTHER): Payer: Self-pay | Admitting: Family

## 2022-06-12 ENCOUNTER — Other Ambulatory Visit: Payer: Self-pay | Admitting: Cardiovascular Disease

## 2022-06-12 DIAGNOSIS — I10 Essential (primary) hypertension: Secondary | ICD-10-CM

## 2022-06-12 MED ORDER — HYDRALAZINE HCL 100 MG PO TABS: 100.0000 mg | ORAL_TABLET | Freq: Three times a day (TID) | ORAL | 3 refills | Status: DC

## 2022-06-12 MED ORDER — ROSUVASTATIN CALCIUM 20 MG PO TABS: 20.0000 mg | ORAL_TABLET | Freq: Every day | ORAL | 3 refills | Status: DC

## 2022-06-12 NOTE — Telephone Encounter (Signed)
Hydralazine adjusted to '100mg'$  TID. Just need to change the directions to say 1 tablet three times per day, please.   TY! Loel Dubonnet, NP

## 2022-06-12 NOTE — Telephone Encounter (Signed)
Pt c/o medication issue:  1. Name of Medication: hydrALAZINE (APRESOLINE) 100 MG tablet   2. How are you currently taking this medication (dosage and times per day)? Not currently taking   3. Are you having a reaction (difficulty breathing--STAT)? No   4. What is your medication issue? Pharmacy is calling wanting to confirm dosage of this medication before filling. Estill Bamberg states they've never had a dosage of more than 300 mg's a day. Please advise.

## 2022-06-12 NOTE — Addendum Note (Signed)
Addended by: Gerald Stabs on: 06/12/2022 09:11 AM   Modules accepted: Orders

## 2022-06-12 NOTE — Telephone Encounter (Signed)
Returned call to patient, spoke with patient's niece (ok per DPR), updated prescriptions sent to pharmacy and labs mailed to patient.      ----- Message from Loel Dubonnet, NP sent at 06/09/2022  9:05 AM EST ----- Kidney function worse than previous. Needs to increase fluid intake. Normal liver.  LDL (bad cholesterol) of 138 which is elevated. Increase Rosuvastatin to '20mg'$  daily.    For management of BP while protecting kidney function:    Continue Amlodipine '10mg'$  daily, Doxazosin '4mg'$  daily, Indapamide 2.'5mg'$ .    Stop Spironolactone.     Adjust Hydralazine to '100mg'$  TID (send updated Rx for '100mg'$  tablet).    Repeat BMP in 1 week.   We will need to hold off on cardiac CT at this time until his kidney function improves. Please ensure he has a soon appointment with nephrology.

## 2022-07-04 ENCOUNTER — Ambulatory Visit (INDEPENDENT_AMBULATORY_CARE_PROVIDER_SITE_OTHER): Payer: No Typology Code available for payment source | Admitting: Family Medicine

## 2022-07-04 VITALS — BP 148/78 | HR 71 | Temp 98.1°F | Ht 71.0 in | Wt 168.0 lb

## 2022-07-04 DIAGNOSIS — R7303 Prediabetes: Secondary | ICD-10-CM | POA: Diagnosis not present

## 2022-07-04 DIAGNOSIS — E782 Mixed hyperlipidemia: Secondary | ICD-10-CM

## 2022-07-04 DIAGNOSIS — I10 Essential (primary) hypertension: Secondary | ICD-10-CM

## 2022-07-04 DIAGNOSIS — Z8673 Personal history of transient ischemic attack (TIA), and cerebral infarction without residual deficits: Secondary | ICD-10-CM

## 2022-07-04 MED ORDER — AMLODIPINE BESYLATE 10 MG PO TABS: 10.0000 mg | ORAL_TABLET | Freq: Every day | ORAL | 1 refills | Status: AC

## 2022-07-04 MED ORDER — DOXAZOSIN MESYLATE 4 MG PO TABS: 4.0000 mg | ORAL_TABLET | Freq: Every day | ORAL | 1 refills | Status: DC

## 2022-07-04 NOTE — Progress Notes (Signed)
   Subjective:    Patient ID: Daryl Perkins, male    DOB: 1950-10-12, 72 y.o.   MRN: [id number]  HPI Follow up for hypertension and medication refills Patient with double vision related to stroke Denies any chest pain shortness of breath Poorly controlled blood pressure Tries to take his medicines but is hard to verify how he is taking these medicines His family member states they tried do the best they can at keeping him on his meds  Review of Systems     Objective:   Physical Exam   General-in no acute distress Eyes-no discharge Lungs-respiratory rate normal, CTA CV-no murmurs,RRR Extremities skin warm dry no edema Neuro grossly normal Behavior normal, alert      Assessment & Plan:  Blood pressure not under good control but under better control than where it was Family to help patient with medication adherence Patient continues to do walking for exercise Follow-up here in March follow-up with hypertension clinic in a few weeks Follow-up with neurology in 1 week Carotid ultrasound ordered Patient's family will be setting him for his follow-up visit with Dr. Theador Hawthorne for nephrology No additional lab work today

## 2022-07-07 DIAGNOSIS — I493 Ventricular premature depolarization: Secondary | ICD-10-CM | POA: Diagnosis not present

## 2022-07-07 DIAGNOSIS — I10 Essential (primary) hypertension: Secondary | ICD-10-CM | POA: Diagnosis not present

## 2022-07-10 ENCOUNTER — Ambulatory Visit (HOSPITAL_COMMUNITY)
Admission: RE | Admit: 2022-07-10 | Discharge: 2022-07-10 | Disposition: A | Discharge status: Home / Self Care | Payer: No Typology Code available for payment source | Source: Ambulatory Visit | Attending: Family Medicine | Admitting: Family Medicine

## 2022-07-10 DIAGNOSIS — I10 Essential (primary) hypertension: Secondary | ICD-10-CM | POA: Insufficient documentation

## 2022-07-10 DIAGNOSIS — E782 Mixed hyperlipidemia: Secondary | ICD-10-CM | POA: Diagnosis not present

## 2022-07-10 DIAGNOSIS — I6522 Occlusion and stenosis of left carotid artery: Secondary | ICD-10-CM | POA: Diagnosis not present

## 2022-07-11 ENCOUNTER — Encounter: Payer: Self-pay | Admitting: Neurology

## 2022-07-11 ENCOUNTER — Ambulatory Visit (INDEPENDENT_AMBULATORY_CARE_PROVIDER_SITE_OTHER): Payer: No Typology Code available for payment source | Admitting: Neurology

## 2022-07-11 VITALS — BP 163/70 | HR 86 | Ht 70.0 in | Wt 169.0 lb

## 2022-07-11 DIAGNOSIS — H4920 Sixth [abducent] nerve palsy, unspecified eye: Secondary | ICD-10-CM | POA: Diagnosis not present

## 2022-07-11 DIAGNOSIS — H532 Diplopia: Secondary | ICD-10-CM | POA: Diagnosis not present

## 2022-07-11 DIAGNOSIS — H4921 Sixth [abducent] nerve palsy, right eye: Secondary | ICD-10-CM | POA: Diagnosis not present

## 2022-07-11 NOTE — Progress Notes (Signed)
[facility] [address] [place]. [place] [address] [phone]       OFFICE CONSULT NOTE  [name] Date of Birth:  1951-01-28 Medical Record Number:  [id number]   Referring MD: Sallee Lange  Reason for Referral: Diplopia  HPI: [name] is a 72 year old African-American male seen today for initial office consultation visit for diplopia.  He is accompanied by his niece.  History is obtained from them and the medical records and personally reviewed pertinent available imaging in PACS.  He has past medical history of diabetes, poorly controlled hypertension, hyperlipidemia , poor dentition and hard of hearing.  He states that since September he has had diplopia which has persisted.  He describes this as monocular mostly in the right eye.  It is improved when he closes the right eye but is persistent when he closes his left eye.  He describes the 2 images as being side-by-side to the right.  He denies any eye pain, headache, loss of vision, focal extremity weakness, numbness gait or balance problems.  1 minor fall few weeks ago when he tripped accidentally but did not have any major injuries.  He denies any prior history of strokes, TIAs or significant neurological problems.  He has remote history of migraine headaches but of late he has been quiescent without any recurrence.  MRI scan of the brain done on 04/18/2022 which was more than a month since onset of the diplopia which showed a tiny right frontal white matter silent lacunar infarct but no lesion in the brainstem.  Carotid ultrasound on 07/10/2022 shows no significant infection.  LDL cholesterol on 06/08/2022 was 138.  I discussed dose increased to 20 mg following this.  Hemoglobin A1c on 01/17/2022 was 6.1.  He states his diabetes is diet controlled.  Blood pressure has been difficult to control today it is 163/70.  He is tolerating well without side effects.  He is tolerating Crestor 20 mg well without side effects  either.  He has been using eyepatch for diplopia.  He has an upcoming appointment to see ophthalmologist Dr. Bing Plume later this week.  ROS:   14 system review of systems is positive for double vision, fall and all other systems negative  PMH:  Past Medical History:  Diagnosis Date   Diabetes mellitus without complication ([facility])    HOH (hard of hearing)    Hypertension    Poor dental hygiene    Prediabetes 12/09/2019    Social History:  Social History   Socioeconomic History   Marital status: Divorced    Spouse name: Not on file   Number of children: Not on file   Years of education: Not on file   Highest education level: Not on file  Occupational History   Not on file  Tobacco Use   Smoking status: Never   Smokeless tobacco: Never  Substance and Sexual Activity   Alcohol use: No   Drug use: No   Sexual activity: Yes    Birth control/protection: None  Other Topics Concern   Not on file  Social History Narrative   Not on file   Social Determinants of Health   Financial Resource Strain: Not on file  Food Insecurity: No Food Insecurity (06/08/2022)   Hunger Vital Sign    Worried About Running Out of Food in the Last Year: Never true    Ran Out of Food in the Last Year: Never true  Transportation Needs: No Transportation Needs (06/08/2022)   [place] -  Hydrologist (Medical): No    Lack of Transportation (Non-Medical): No  Physical Activity: Sufficiently Active (06/08/2022)   Exercise Vital Sign    Days of Exercise per Week: 7 days    Minutes of Exercise per Session: 150+ min  Stress: Not on file  Social Connections: Not on file  Intimate Partner Violence: Not on file    Medications:   Current Outpatient Medications on File Prior to Visit  Medication Sig Dispense Refill   amLODipine (NORVASC) 10 MG tablet Take 1 tablet (10 mg total) by mouth daily. 90 tablet 1   aspirin EC 81 MG tablet Take 81 mg by mouth daily. Swallow whole.      doxazosin (CARDURA) 4 MG tablet Take 1 tablet (4 mg total) by mouth daily. 90 tablet 1   hydrALAZINE (APRESOLINE) 100 MG tablet Take 1 tablet (100 mg total) by mouth 3 (three) times daily. Take 1 tablet 3 times per day 270 tablet 3   indapamide (LOZOL) 2.5 MG tablet Take one tablet po daily 90 tablet 1   rosuvastatin (CRESTOR) 20 MG tablet Take 1 tablet (20 mg total) by mouth daily. 90 tablet 3   metoprolol tartrate (LOPRESSOR) 100 MG tablet PLEASE TAKE 2 HOURS PRIOR TO CARDIAC CT (Patient not taking: Reported on 07/11/2022) 1 tablet 0   No current facility-administered medications on file prior to visit.    Allergies:  No Known Allergies  Physical Exam General: well developed, well nourished pleasant elderly African-American male, seated, in no evident distress Head: head normocephalic and atraumatic.   Neck: supple with no carotid or supraclavicular bruits Cardiovascular: regular rate and rhythm, no murmurs Musculoskeletal: no deformity Skin:  no rash/petichiae Vascular:  Normal pulses all extremities  Neurologic Exam Mental Status: Awake and fully alert. Oriented to place and time. Recent and remote memory intact. Attention span, concentration and fund of knowledge appropriate. Mood and affect appropriate.  Cranial Nerves: Fundoscopic exam reveals sharp disc margins. Pupils equal, briskly reactive to light. Extraocular movements show slight esotropia of the right eye .no nystagmus.  No diplopia with movement he looks to the right with greater separation of the 2 images visual fields full to confrontation. Hearing intact. Facial sensation intact. Face, tongue, palate moves normally and symmetrically.  Motor: Normal bulk and tone. Normal strength in all tested extremity muscles. Sensory.: intact to touch , pinprick , position and vibratory sensation.  Coordination: Rapid alternating movements normal in all extremities. Finger-to-nose and heel-to-shin performed accurately bilaterally. Gait  and Station: Arises from chair without difficulty. Stance is normal. Gait demonstrates normal stride length and balance . Able to heel, toe and tandem walk without difficulty.  Reflexes: 1+ and symmetric. Toes downgoing.   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  Etiology indeterminate but possibly microvascular disease related to hypertension or diabetes.  Silent tiny right frontal lacunar infarct on MRI in October 2020 due to small vessel disease.  Vascular risk factors of borderline diabetes, uncontrolled hypertension     PLAN:I had a long d/w patient and his niece about his diplopia, discussed differential diagnosis, evaluation and treatment plan ,, personally independently reviewed imaging studies and stroke evaluation results and answered questions.Continue aspirin 81 mg daily  for secondary stroke prevention and maintain strict control of hypertension with blood pressure goal below 130/90, diabetes with. HbA1c goal below 6.5% and lipids  with LDL cholesterol goal below 70 mg/dL. I also advised the patient to eat a healthy diet with plenty of whole grains, cereals, fruits and vegetables, exercise regularly and maintain ideal body weight .check MR angiogram of the brain to look for vascular stenosis aneurysm.  Check hemoglobin A1c.  Advised him to keep his upcoming appointment with ophthalmologist Dr. Bing Plume and will likely need prescription prisms glasses to overcome his diplopia since it has persisted for more than 3 months. followup in the future with me in 3 months or call earlier if necessary.  Greater than 50% time during this 45-minute consultation visit was spent in counseling and coordination of care about his diplopia and silent lacunar infarct and answering questions.  Antony Contras, MD Note: This document was prepared with digital dictation and  possible smart phrase technology. Any transcriptional errors that result from this process are unintentional.

## 2022-07-11 NOTE — Patient Instructions (Addendum)
I had a long d/w patient and his niece about his diplopia, discussed differential diagnosis, evaluation and treatment plan ,, personally independently reviewed imaging studies and stroke evaluation results and answered questions.Continue aspirin 81 mg daily  for secondary stroke prevention and maintain strict control of hypertension with blood pressure goal below 130/90, diabetes with. HbA1c goal below 6.5% and lipids with LDL cholesterol goal below 70 mg/dL. I also advised the patient to eat a healthy diet with plenty of whole grains, cereals, fruits and vegetables, exercise regularly and maintain ideal body weight .check MR angiogram of the brain to look for vascular stenosis aneurysm.  Check hemoglobin A1c.  Advised him to keep his upcoming appointment with ophthalmologist Dr. Bing Plume and will likely need prescription prisms glasses to overcome his diplopia since it has persisted for more than 3 months. followup in the future with me in 3 months or call earlier if necessary  Diplopia Diplopia is a condition in which a person sees two of a single object. It is also called double vision. There are two types of diplopia. Monocular diplopia. This is double vision that is present when only one eye is open. It can occur in one or both eyes. Monocular diplopia is often caused by irregularity in the surface layer of your eye (cornea), a clouding of the lens in your eye (cataract), or a problem in the way your eye focuses light. Binocular diplopia. This is double vision that is present only when both eyes are open. When you close one eye, the double vision will go away. Binocular diplopia may be more serious. It can be caused by: Problems with the nerves or muscles that are responsible for eye movement. Disease of the nerves (neurologic disease). Immune system conditions, such as Graves' disease. Migraine headaches. Tumors. An infection. A stroke. An injury. There are many causes of diplopia. Some are not  dangerous and can be easily corrected. Diplopia may also be a symptom of a serious medical problem. You may need to see a health care provider who specializes in eye conditions (ophthalmologist) or a nerve specialist (neurologist) to find the cause. Follow these instructions at home:  Pay attention to any changes in your vision. Tell your health care provider about them. Do not drive or operate machinery if diplopia interferes with your vision. Keep all follow-up visits. This is important. Contact a health care provider if: Your diplopia gets worse. You develop any other symptoms along with your diplopia, such as: Weakness. Numbness. Headache. Eye pain. Clumsiness. Nausea. Drooping eyelids. Abnormal movement of one eye. Get help right away if: You have sudden vision loss. You suddenly get a very bad headache. You have sudden weakness or numbness. You develop droopiness on one side of your face. You suddenly lose the ability to speak, understand speech, or both. You develop difficulty breathing. These symptoms may represent a serious problem that is an emergency. Do not wait to see if the symptoms will go away. Get medical help right away. Call your local emergency services (911 in the [place] Do not drive yourself to the hospital. Summary Diplopia is a condition in which a person sees two of a single object. It is also called double vision. Monocular diplopia is double vision that affects only one eye at a time. It is often caused by irregularity in the surface layer of your eye (cornea), a clouding of the lens in your eye (cataract), or a problem in the way your eye focuses light. Binocular diplopia is double vision  that affects both eyes at the same time. However, when you shut one eye, the double vision will go away. Binocular diplopia may be more serious. If you have diplopia, you may need to see a health care provider who specializes in eye conditions (ophthalmologist) or a nerve  specialist (neurologist) to find the cause. This information is not intended to replace advice given to you by your health care provider. Make sure you discuss any questions you have with your health care provider. Document Revised: 10/21/2020 Document Reviewed: 10/21/2020 Elsevier Patient Education  [place].

## 2022-07-12 ENCOUNTER — Emergency Department (HOSPITAL_COMMUNITY)
Admission: EM | Admit: 2022-07-12 | Discharge: 2022-07-12 | Disposition: A | Discharge status: Home / Self Care | Payer: No Typology Code available for payment source | Attending: Emergency Medicine | Admitting: Emergency Medicine

## 2022-07-12 ENCOUNTER — Emergency Department (HOSPITAL_COMMUNITY): Payer: No Typology Code available for payment source

## 2022-07-12 ENCOUNTER — Telehealth: Payer: Self-pay | Admitting: Neurology

## 2022-07-12 ENCOUNTER — Other Ambulatory Visit: Payer: Self-pay

## 2022-07-12 ENCOUNTER — Encounter (HOSPITAL_COMMUNITY): Payer: Self-pay

## 2022-07-12 DIAGNOSIS — S0181XA Laceration without foreign body of other part of head, initial encounter: Secondary | ICD-10-CM

## 2022-07-12 DIAGNOSIS — S0990XA Unspecified injury of head, initial encounter: Secondary | ICD-10-CM

## 2022-07-12 DIAGNOSIS — Z23 Encounter for immunization: Secondary | ICD-10-CM | POA: Diagnosis not present

## 2022-07-12 DIAGNOSIS — W109XXA Fall (on) (from) unspecified stairs and steps, initial encounter: Secondary | ICD-10-CM | POA: Diagnosis not present

## 2022-07-12 DIAGNOSIS — I10 Essential (primary) hypertension: Secondary | ICD-10-CM | POA: Insufficient documentation

## 2022-07-12 DIAGNOSIS — T1490XA Injury, unspecified, initial encounter: Secondary | ICD-10-CM | POA: Diagnosis not present

## 2022-07-12 DIAGNOSIS — Y9301 Activity, walking, marching and hiking: Secondary | ICD-10-CM | POA: Diagnosis not present

## 2022-07-12 DIAGNOSIS — Z7982 Long term (current) use of aspirin: Secondary | ICD-10-CM | POA: Diagnosis not present

## 2022-07-12 DIAGNOSIS — E119 Type 2 diabetes mellitus without complications: Secondary | ICD-10-CM | POA: Diagnosis not present

## 2022-07-12 DIAGNOSIS — W19XXXA Unspecified fall, initial encounter: Secondary | ICD-10-CM

## 2022-07-12 DIAGNOSIS — S0003XA Contusion of scalp, initial encounter: Secondary | ICD-10-CM | POA: Diagnosis not present

## 2022-07-12 DIAGNOSIS — Z79899 Other long term (current) drug therapy: Secondary | ICD-10-CM | POA: Insufficient documentation

## 2022-07-12 LAB — HEMOGLOBIN A1C
Est. average glucose Bld gHb Est-mCnc: 131 mg/dL
Hgb A1c MFr Bld: 6.2 % — ABNORMAL HIGH (ref 4.8–5.6)

## 2022-07-12 MED ORDER — TETANUS-DIPHTH-ACELL PERTUSSIS 5-2.5-18.5 LF-MCG/0.5 IM SUSY
0.5000 mL | PREFILLED_SYRINGE | Freq: Once | INTRAMUSCULAR | Status: AC
  Administered 2022-07-12: 0.5 mL via INTRAMUSCULAR
  Filled 2022-07-12: qty 0.5

## 2022-07-12 MED ORDER — LIDOCAINE-EPINEPHRINE (PF) 2 %-1:200000 IJ SOLN
10.0000 mL | Freq: Once | INTRAMUSCULAR | Status: DC
  Filled 2022-07-12: qty 20

## 2022-07-12 NOTE — Discharge Instructions (Signed)
Thank you for letting us take care of you today.  Your CT scans of your head and neck did not show any internal injury. I placed 1 suture and dermabond (skin glue) to close the cut on your head. Please follow up with your PCP in 1 week for re-evaluation and to have this suture removed. If unable to see PCP, you may follow up at urgent care or the emergency department. Please see attached laceration care instructions. Keep wound clean and dry. Apply antibiotic ointment twice daily and keep wound covered when you leave the house. Take Tylenol at home to control any pain related to your laceration.   If you develop confusion, fever, severe headache, weakness on one side of your body or another, injure your head or fall again, or other concerns, please return to nearest emergency department for re-evaluation.

## 2022-07-12 NOTE — Progress Notes (Signed)
Kindly inform patient that screening test for diabetes is borderline but acceptable.

## 2022-07-12 NOTE — ED Triage Notes (Addendum)
Pt presents to ED following fall at home. Pt states he was going down back steps, tripped and fell. Pt with laceration to forehead. Pt denies LOC. Pt states he has had multiple fall and states he fell back in Oct and since that fall he has had double vision in his right eye. Pt denies blood thinners

## 2022-07-12 NOTE — ED Provider Notes (Signed)
[facility] EMERGENCY DEPARTMENT Provider Note   CSN: [id number] Arrival date & time: 07/12/22  7371     History  Chief Complaint  Patient presents with   Daryl Perkins    Daryl Perkins is a 72 y.o. male with past medical history hypertension and diabetes who presents to the ED after mechanical fall this morning.  Patient states that he left his house to go on his daily walk when he missed a step outside on the porch causing him to fall down, hit his face, and sustained small laceration to the left forehead.  Bleeding is well-controlled and patient denies taking any anticoagulants.  Patient denies any symptoms preceding the fall including lightheadedness, dizziness, focal weakness, or syncope.  He also denies any symptoms post fall as well as headache, nausea, vomiting, or any acute vision changes.  He reports that he has diplopia to the right eye, however, this is chronic and has been ongoing for a few months now.  He reports previous evaluation for this and was told that he had a "small stroke."  He has outpatient follow-up arranged with ophthalmology to be evaluated for this tomorrow.  He has been able to walk since the fall without complication.   Home Medications Prior to Admission medications   Medication Sig Start Date End Date Taking? Authorizing Provider  amLODipine (NORVASC) 10 MG tablet Take 1 tablet (10 mg total) by mouth daily. 07/04/22  Yes Kathyrn Drown, MD  aspirin EC 81 MG tablet Take 81 mg by mouth daily. Swallow whole.   Yes [provider]  doxazosin (CARDURA) 4 MG tablet Take 1 tablet (4 mg total) by mouth daily. 07/04/22  Yes Kathyrn Drown, MD  hydrALAZINE (APRESOLINE) 100 MG tablet Take 1 tablet (100 mg total) by mouth 3 (three) times daily. Take 1 tablet 3 times per day 06/12/22  Yes Loel Dubonnet, NP  indapamide (LOZOL) 2.5 MG tablet Take one tablet po daily 04/06/22  Yes Luking, Scott A, MD  rosuvastatin (CRESTOR) 20 MG tablet Take 1 tablet (20 mg total) by  mouth daily. 06/12/22  Yes Loel Dubonnet, NP  metoprolol tartrate (LOPRESSOR) 100 MG tablet PLEASE TAKE 2 HOURS PRIOR TO CARDIAC CT Patient not taking: Reported on 07/11/2022 06/08/22   Loel Dubonnet, NP  potassium chloride (KLOR-CON M) 10 MEQ tablet Take 10 mEq by mouth 2 (two) times daily. Patient not taking: Reported on 07/12/2022 04/06/22   [provider]      Allergies    Patient has no known allergies.    Review of Systems   Review of Systems  Constitutional:  Negative for activity change, appetite change, chills and fever.  HENT:  Negative for congestion, ear pain and sore throat.   Eyes:  Negative for photophobia, pain and visual disturbance.  Respiratory:  Negative for cough, chest tightness and shortness of breath.   Cardiovascular:  Negative for chest pain, palpitations and leg swelling.  Gastrointestinal:  Negative for abdominal pain, diarrhea, nausea and vomiting.  Genitourinary:  Negative for dysuria and hematuria.  Musculoskeletal:  Negative for arthralgias, back pain, gait problem and neck stiffness.  Skin:  Positive for wound. Negative for color change and rash.  Neurological:  Negative for dizziness, seizures, syncope, facial asymmetry, speech difficulty, weakness, light-headedness, numbness and headaches.  Psychiatric/Behavioral:  Negative for confusion.   All other systems reviewed and are negative.   Physical Exam Updated Vital Signs BP (!) 144/84   Pulse 65   Temp 98 F (  36.7 C) (Oral)   Resp (!) 21   Ht '5\' 10"'$  (1.778 m)   Wt 72.6 kg   SpO2 100%   BMI 22.96 kg/m  Physical Exam Vitals and nursing note reviewed.  Constitutional:      General: He is not in acute distress.    Appearance: Normal appearance.  HENT:     Head: Normocephalic.     Comments: ~2cm irregular laceration with vertically linear portion that well approximates to left forehead with surrounding superficial abrasion, bleeding controlled, small circular abrasion to bridge of  nose and lateral inferior left nares without active bleeding,     Mouth/Throat:     Mouth: Mucous membranes are moist.  Eyes:     Extraocular Movements: Extraocular movements intact.     Conjunctiva/sclera: Conjunctivae normal.     Pupils: Pupils are equal, round, and reactive to light.  Cardiovascular:     Rate and Rhythm: Normal rate and regular rhythm.     Heart sounds: No murmur heard. Pulmonary:     Effort: Pulmonary effort is normal.     Breath sounds: Normal breath sounds.  Abdominal:     General: Abdomen is flat.     Palpations: Abdomen is soft.  Musculoskeletal:        General: Normal range of motion.     Cervical back: Normal range of motion and neck supple. No rigidity.     Right lower leg: No edema.     Left lower leg: No edema.  Skin:    General: Skin is warm and dry.     Capillary Refill: Capillary refill takes less than 2 seconds.  Neurological:     Mental Status: He is alert and oriented to person, place, and time. Mental status is at baseline.     GCS: GCS eye subscore is 4. GCS verbal subscore is 5. GCS motor subscore is 6.     Cranial Nerves: No dysarthria or facial asymmetry.     Sensory: Sensation is intact.     Motor: No weakness.     Coordination: Coordination is intact.  Psychiatric:        Mood and Affect: Mood normal.        Behavior: Behavior normal.        Thought Content: Thought content normal.     ED Results / Procedures / Treatments   Labs (all labs ordered are listed, but only abnormal results are displayed) Labs Reviewed - No data to display  EKG None  Radiology   Procedures .Marland KitchenLaceration Repair  Date/Time: 07/12/2022 11:45 AM  Performed by: Suzzette Righter, PA-C Authorized by: Suzzette Righter, PA-C   Consent:    Consent obtained:  Verbal   Consent given by:  Patient   Risks, benefits, and alternatives were discussed: yes     Risks discussed:  Infection, pain, poor cosmetic result, need for additional repair and poor wound  healing   Alternatives discussed:  Observation and no treatment Universal protocol:    Procedure explained and questions answered to patient or proxy's satisfaction: yes     Imaging studies available: yes     Immediately prior to procedure, a time out was called: yes     Patient identity confirmed:  Verbally with patient Anesthesia:    Anesthesia method:  Local infiltration   Local anesthetic:  Lidocaine 2% WITH epi (58m) Laceration details:    Location:  Face   Face location:  Forehead   Length (cm):  2 Pre-procedure details:  Preparation:  Patient was prepped and draped in usual sterile fashion Exploration:    Limited defect created (wound extended): no     Hemostasis achieved with:  Direct pressure and epinephrine   Wound exploration: entire depth of wound visualized     Wound extent comment:  Superficial   Contaminated: no   Treatment:    Area cleansed with:  Povidone-iodine   Amount of cleaning:  Extensive   Irrigation solution:  Sterile water   Irrigation volume:  169m   Irrigation method:  Tap and syringe   Visualized foreign bodies/material removed: no     Debridement:  None   Undermining:  None   Layers repaired: superficial. Skin repair:    Repair method:  Sutures and tissue adhesive   Suture size:  5-0   Suture material:  Nylon   Suture technique:  Simple interrupted   Number of sutures:  1 Approximation:    Approximation:  Close Repair type:    Repair type:  Simple Post-procedure details:    Dressing:  Non-adherent dressing   Procedure completion:  Tolerated well, no immediate complications    Medications Ordered in ED Medications  lidocaine-EPINEPHrine (XYLOCAINE W/EPI) 2 %-1:200000 (PF) injection 10 mL (has no administration in time range)  Tdap (BOOSTRIX) injection 0.5 mL (0.5 mLs Intramuscular Given 07/12/22 1031)    ED Course/ Medical Decision Making/ A&P                           Medical Decision Making Amount and/or Complexity of Data  Reviewed Radiology: ordered. Decision-making details documented in ED Course.  Risk Prescription drug management.  [place] CT Head Injury/Trauma Rule from [email or web address] on 07/12/2022 ** All calculations should be rechecked by clinician prior to use **  RESULT SUMMARY: Consider CT   The [place] Head CT Rule cannot rule out need for imaging. Consider CT.   INPUTS: Age  --> 0 = No Patient on blood thinners --> 0 = No Seizure after injury --> 0 =  No GCS  --> 0 = No Suspected open or depressed skull fracture --> 0 = No Any sign of basilar skull fracture? --> 0 = No ?2 episodes of vomiting --> 0 = No Age ?65 years --> 1 = Yes Retrograde amnesia to the event ? 30 minutes --> 0 = No "Dangerous" mechanism? --> 0 = No  This is a 72year old male who presents to the ED status post mechanical fall where he hit his head and sustained small laceration to the left forehead in addition to multiple abrasions.  Patient is alert and oriented, answering questions appropriately, and has no other identifiable injury.  He has been able to ambulate since the injury.  He is neurologically intact with no focal weakness or confusion.  He has no cervical tenderness and has full range of motion of the neck.  Tetanus vaccination was updated in the ED today.  Patient is not on anticoagulants and sustained only minor head trauma post mechanical fall, however, with his age we proceeded with CT scan of the head and neck to rule out intracranial bleeding or acute cervical fracture or other injury.  Fortunately, the scans were negative.  Patien and multiple family members updated on the results.  On multiple re-exams, patient remains in good spirits, alert and oriented, and without neurological deficits.  Simple laceration to the forehead was repaired with 1 nonabsorbable suture to superior aspect as well as placement of Dermabond on  inferior aspect that was more superficial in nature. Wound extensively cleaned with sterile  water. No foreign body was visualized. Patient tolerated this procedure very well.  Wound care instructions given to both patient and family members with strict return precautions as well as instructions to follow-up in 1 week for suture removal and reevaluation or sooner if patient develops any new symptoms or injury.  Patient and family members understood and agreed with plan.  Patient also examined by attending MD who agreed with management and discharge patient home.  Patient stable at time of discharge.         Final Clinical Impression(s) / ED Diagnoses Final diagnoses:  Injury of head, initial encounter  Facial laceration, initial encounter  Fall, initial encounter    Rx / DC Orders ED Discharge Orders     None         Turner Daniels 07/12/22 1422    Isla Pence, MD 07/12/22 1436

## 2022-07-12 NOTE — Telephone Encounter (Signed)
[place]: [id number] exp. 07/12/22-09/10/22 sent to [facility] [phone]

## 2022-07-13 ENCOUNTER — Telehealth: Payer: Self-pay

## 2022-07-13 DIAGNOSIS — H33011 Retinal detachment with single break, right eye: Secondary | ICD-10-CM | POA: Diagnosis not present

## 2022-07-13 DIAGNOSIS — H43811 Vitreous degeneration, right eye: Secondary | ICD-10-CM | POA: Diagnosis not present

## 2022-07-13 DIAGNOSIS — H2513 Age-related nuclear cataract, bilateral: Secondary | ICD-10-CM | POA: Diagnosis not present

## 2022-07-13 DIAGNOSIS — H35033 Hypertensive retinopathy, bilateral: Secondary | ICD-10-CM | POA: Diagnosis not present

## 2022-07-13 DIAGNOSIS — H35371 Puckering of macula, right eye: Secondary | ICD-10-CM | POA: Diagnosis not present

## 2022-07-13 DIAGNOSIS — H35342 Macular cyst, hole, or pseudohole, left eye: Secondary | ICD-10-CM | POA: Diagnosis not present

## 2022-07-13 DIAGNOSIS — H25811 Combined forms of age-related cataract, right eye: Secondary | ICD-10-CM | POA: Diagnosis not present

## 2022-07-13 LAB — HM DIABETES EYE EXAM

## 2022-07-13 NOTE — Telephone Encounter (Signed)
Call received from eye doctor Chales Salmon , stating patient has retinal detachment of his R eye and will require surgery , concern about uncontrolled BP , she requested a call at cell # [phone] please advise

## 2022-07-18 NOTE — Telephone Encounter (Signed)
I did place a call to optometrist Dr. Felix Ahmadi left phone message for her to call me on my cell number-[phone]

## 2022-07-19 ENCOUNTER — Ambulatory Visit (INDEPENDENT_AMBULATORY_CARE_PROVIDER_SITE_OTHER): Payer: No Typology Code available for payment source | Admitting: Family Medicine

## 2022-07-19 VITALS — BP 150/80 | HR 88 | Temp 98.6°F | Ht 70.0 in | Wt 163.8 lb

## 2022-07-19 DIAGNOSIS — I10 Essential (primary) hypertension: Secondary | ICD-10-CM | POA: Diagnosis not present

## 2022-07-19 DIAGNOSIS — Z4802 Encounter for removal of sutures: Secondary | ICD-10-CM

## 2022-07-19 DIAGNOSIS — E782 Mixed hyperlipidemia: Secondary | ICD-10-CM | POA: Diagnosis not present

## 2022-07-19 DIAGNOSIS — R7303 Prediabetes: Secondary | ICD-10-CM | POA: Diagnosis not present

## 2022-07-19 MED ORDER — DOXAZOSIN MESYLATE 4 MG PO TABS: ORAL_TABLET | ORAL | 1 refills | Status: DC

## 2022-07-19 NOTE — Progress Notes (Signed)
   Subjective:    Patient ID: Daryl Perkins, male    DOB: [birth year], 72 y.o.   MRN: [id number]  HPI  Patient arrives today to have stitches removed  from forehead. He sustained a fall hit his head had a laceration 1 stitch was placed here today to have it removed In addition to this has a partially detached retina that they are going to do surgery next week on Also has uncontrolled high blood pressure takes his medicine regular basis but blood pressure has been elevated recently he is not quite sure regarding his medicines but family states they are giving him his medicines on a regular basis he denies any chest tract tightness or shortness of breath he does relate some mild headache also relates ongoing double vision but he is seeing eye specialist for this Patient has no concerns or issues today.    Review of Systems     Objective:   Physical Exam  General-in no acute distress Eyes-no discharge Lungs-respiratory rate normal, CTA CV-no murmurs,RRR Extremities skin warm dry no edema Neuro grossly normal Behavior normal, alert       Assessment & Plan:  1. Mixed hyperlipidemia Healthy diet - doxazosin (CARDURA) 4 MG tablet; One bid  Dispense: 180 tablet; Refill: 1  2. Prediabetes A1c previously decent control - doxazosin (CARDURA) 4 MG tablet; One bid  Dispense: 180 tablet; Refill: 1  3. Uncontrolled hypertension Bump up dose now 1 twice daily - doxazosin (CARDURA) 4 MG tablet; One bid  Dispense: 180 tablet; Refill: 1  4. Encounter for removal of sutures Suture removed Proper wound care discussed Follow-up if progressive troubles  Recheck in 1 month  I did speak with the eye specialist.  They are concerned about his blood pressure being elevated with surgery late next week We reiterated to the patient the importance of taking medicine I believe his headaches that he is having currently is from the recent fall but no sign of brain bleeding He does have partial  retinal tear apparently gummy doing surgery next week  Suture was removed from the forehead but unfortunately there is nonunion of the wound this is too late to put in any type of stitches currently so therefore I recommend Steri-Strip if any signs of infection notify us follow-up if problems

## 2022-07-19 NOTE — Telephone Encounter (Signed)
I did have discussion with Dr. Felix Ahmadi They are requesting that we recheck his blood pressure nurse visit Monday or Tuesday before his surgery next week please set this thank you very important for the patient to make sure he takes his medicine on that day

## 2022-07-19 NOTE — Progress Notes (Signed)
Advanced Hypertension Clinic Assessment:    Date:  07/20/2022   ID:  Daryl Perkins, DOB [birth year], MRN [id number]  PCP:  Kathyrn Drown, MD  Cardiologist:  None  Nephrologist:  Referring MD: Kathyrn Drown, MD   CC: Hypertension  History of Present Illness:    Daryl Perkins is a 72 y.o. male with a hx of HTN, CVA, CKD, aortic atherosclerosis, syncope here to follow up in the Advanced Hypertension Clinic. Family history of heart disease, hypertension.    Prior CT 01/2017 with aortic atherosclerosis. Prior 04/05/22 renal duplex with no stenosis and minimal iliac artery aneurysm.  Established with ADV HTN clinic 06/08/22.  Daryl Perkins was diagnosed with hypertension in his 16s. Niece helps him with pill pack to promote compliance. He was walking for exercise. He did note a prior episode of syncope in October. Hydralazine increased to '100mg'$  AM, '50mg'$  afternoon, '100mg'$  PM. Amlodipine, Doxazosin, Indapamide, Spironolactone were continued. 14 day ZIO ordered forprior syncope as well as cardiac CTA due to TWI lateral leads. Rosuvastatin was increased due to LDL 138. Spironolactone stopped due to renal function and Hydralazine increased to '100mg'$  TID. CTA put on hold due to renal function. He did not have repeat BMP collected.  Preliminary monitor report. With predominantly NSR average heart rate 75 bpm. 2 runs of VT longest/fastest 6 beats 185 bpm. 20 run sof SVT. PAC burden 2.5%, PVC burden 2.5%.   ED visit 07/12/22 after mechanical fall. Scans were negative. Required 1 nonabsorbable suture with recommend to follow up in one year for suture removal.   There is documentation from primary care 07/13/22 stating Dr. Felix Ahmadi (eye doctor) noted retinal detachment of R eye which would require surgery. Saw primary care 07/19/22. Doxazosin increased ot '8mg'$  twice daily. Suture removed.   Presents today for follow up with his niece. BP at home 148-170. Reports compliance with medications. Has been  walking for exercise. Reports no shortness of breath nor dyspnea on exertion. Reports no chest pain, pressure, or tightness. No edema, orthopnea, PND. Reports no palpitations.  Notes he is pending eye surgery next week and hopeful to have foot surgery when his blood pressure is better controlled.   Previous antihypertensives: Losartan -  HCTZ -  Spironolactone - discontinued due to renal function   Past Medical History:  Diagnosis Date   Diabetes mellitus without complication ([place])    HOH (hard of hearing)    Hypertension    Poor dental hygiene    Prediabetes 12/09/2019    Past Surgical History:  Procedure Laterality Date   BACK SURGERY     1987 and 1989. lumbar disc   COLONOSCOPY N/A 06/09/2020   Procedure: COLONOSCOPY;  Surgeon: Daneil Dolin, MD;  Location: [facility];  Service: Endoscopy;  Laterality: N/A;  12:00   INGUINAL HERNIA REPAIR Right 09/09/2012   Procedure: HERNIA REPAIR INGUINAL ADULT;  Surgeon: Jamesetta So, MD;  Location: [facility];  Service: General;  Laterality: Right;   INSERTION OF MESH Right 09/09/2012   Procedure: INSERTION OF MESH;  Surgeon: Jamesetta So, MD;  Location: [facility];  Service: General;  Laterality: Right;   POLYPECTOMY  06/09/2020   Procedure: POLYPECTOMY;  Surgeon: Daneil Dolin, MD;  Location: [facility];  Service: Endoscopy;;  rectal, hepatic flexure    Current Medications: Current Meds  Medication Sig   amLODipine (NORVASC) 10 MG tablet Take 1 tablet (10 mg total) by mouth daily.   aspirin EC 81 MG  tablet Take 81 mg by mouth daily. Swallow whole.   doxazosin (CARDURA) 4 MG tablet Take 8 mg by mouth 2 (two) times daily.   hydrALAZINE (APRESOLINE) 100 MG tablet Take 1 tablet (100 mg total) by mouth 3 (three) times daily. Take 1 tablet 3 times per day   indapamide (LOZOL) 2.5 MG tablet Take one tablet po daily   rosuvastatin (CRESTOR) 20 MG tablet Take 1 tablet (20 mg total) by mouth daily.   [DISCONTINUED] doxazosin (CARDURA) 4 MG  tablet One bid   [DISCONTINUED] metoprolol tartrate (LOPRESSOR) 100 MG tablet PLEASE TAKE 2 HOURS PRIOR TO CARDIAC CT     Allergies:   Patient has no known allergies.   Social History   Socioeconomic History   Marital status: Divorced    Spouse name: Not on file   Number of children: Not on file   Years of education: Not on file   Highest education level: Not on file  Occupational History   Not on file  Tobacco Use   Smoking status: Never   Smokeless tobacco: Never  Substance and Sexual Activity   Alcohol use: No   Drug use: No   Sexual activity: Yes    Birth control/protection: None  Other Topics Concern   Not on file  Social History Narrative   Not on file   Social Determinants of Health   Financial Resource Strain: Not on file  Food Insecurity: No Food Insecurity (06/08/2022)   Hunger Vital Sign    Worried About Running Out of Food in the Last Year: Never true    Ran Out of Food in the Last Year: Never true  Transportation Needs: No Transportation Needs (06/08/2022)   PRAPARE - Hydrologist (Medical): No    Lack of Transportation (Non-Medical): No  Physical Activity: Sufficiently Active (06/08/2022)   Exercise Vital Sign    Days of Exercise per Week: 7 days    Minutes of Exercise per Session: 150+ min  Stress: Not on file  Social Connections: Not on file     Family History: The patient's family history includes Heart attack in his brother; Hypertension in his niece and sister.  ROS:   Please see the history of present illness.     All other systems reviewed and are negative.  EKGs/Labs/Other Studies Reviewed:    EKG:  EKG is ordered today.  The ekg ordered today demonstrates NSR 85 bpm with PVC and TWI V4-V6.   Recent Labs: 03/29/2022: TSH 3.520 06/08/2022: ALT 11; BUN 24; Creatinine, Ser 2.14; Potassium 3.8; Sodium 137   Recent Lipid Panel    Component Value Date/Time   CHOL 246 (H) 01/17/2022 1042   TRIG 54 01/17/2022 1042    HDL 51 01/17/2022 1042   CHOLHDL 4.8 01/17/2022 1042   CHOLHDL 4.5 08/08/2013 1246   VLDL 24 08/08/2013 1246   LDLCALC 186 (H) 01/17/2022 1042   LDLDIRECT 138 (H) 06/08/2022 1456    Physical Exam:   VS:  BP (!) 160/70   Pulse 76   Ht '5\' 10"'$  (1.778 m)   Wt 167 lb (75.8 kg)   BMI 23.96 kg/m  , BMI Body mass index is 23.96 kg/m. GENERAL:  Well appearing HEENT: Pupils equal round and reactive, fundi not visualized, oral mucosa unremarkable. R eye patch in place. Steri strips to left side of forehead with no bleeding.  NECK:  No jugular venous distention, waveform within normal limits, carotid upstroke brisk and symmetric, no bruits, no  thyromegaly LYMPHATICS:  No cervical adenopathy LUNGS:  Clear to auscultation bilaterally HEART:  RRR.  PMI not displaced or sustained,S1 and S2 within normal limits, no S3, no S4, no clicks, no rubs, no murmurs ABD:  Flat, positive bowel sounds normal in frequency in pitch, no bruits, no rebound, no guarding, no midline pulsatile mass, no hepatomegaly, no splenomegaly EXT:  2 plus pulses throughout, no edema, no cyanosis no clubbing SKIN:  No rashes no nodules NEURO:  Cranial nerves II through XII grossly intact, motor grossly intact throughout PSYCH:  Cognitively intact, oriented to person place and time   ASSESSMENT/PLAN:    HTN - BP not at goal of <130/80. Continue amlodipine '10mg'$  QD, Doxazosin '8mg'$  BID, Hydralazine '100mg'$  TID, Indapamide 2.'5mg'$ , Enrolled in ADV HTN research study. Discussed to monitor BP at home at least 2 hours after medications and sitting for 5-10 minutes. PCP just adjusted Doxazosin yesterday, will reach out in one week and if BP still >130/80, plan to add Carvedilol vs Imdur. Secondary workup: No snoring suggestive of OSA. 03/2022 TSH 3.5. 04/05/22 no renal artery stenosis and minimal iliac artery aneurysm.   PVC / Syncope  - PVC noted on prior EKG. Monitor preliminary report with predominantly NSR with runs of SVT. Asymptomatic  with no palpitations nor recurrent syncope. Consider Carvedilol for BP control, as above.   Abnormal EKG - EKG 12/7/23with new TWI lateral leads. No chest pain, dyspnea. CTA unable to be performed due to renal function. Plan for lexiscan myoview  Shared Decision Making/Informed Consent The risks [chest pain, shortness of breath, cardiac arrhythmias, dizziness, blood pressure fluctuations, myocardial infarction, stroke/transient ischemic attack, nausea, vomiting, allergic reaction, radiation exposure, metallic taste sensation and life-threatening complications (estimated to be 1 in 10,000)], benefits (risk stratification, diagnosing coronary artery disease, treatment guidance) and alternatives of a nuclear stress test were discussed in detail with Mr. Briseno and he agrees to proceed.   Hx of CVA - Continue aspirin, Rosuvastatin.   Aortic atherosclerosis / HLD, LDL goal <70 - 12.7/23 LDL 138. Rosuvastatin increased to '20mg'$  daily. Repeat direct LDL at follow up.   CKD - Careful titration of diuretic and antihypertensive.  BMP today.   Screening for Secondary Hypertension:  Click here to document screening for secondary causes of HTN  :[id number]}  Relevant Labs/Studies:    Latest Ref Rng & Units 06/08/2022    2:56 PM 05/02/2022   12:21 PM 03/29/2022    8:47 AM  Basic Labs  Sodium 134 - 144 mmol/L 137  136  139   Potassium 3.5 - 5.2 mmol/L 3.8  3.7  3.9   Creatinine 0.76 - 1.27 mg/dL 2.14  1.77  1.60        Latest Ref Rng & Units 03/29/2022    8:47 AM  Thyroid   TSH 0.450 - 4.500 uIU/mL 3.520        Latest Ref Rng & Units 02/06/2022   10:01 AM  Renin/Aldosterone   Aldosterone 0.0 - 30.0 ng/dL 6.3   Renin 0.167 - 5.380 ng/mL/hr 2.351   Aldos/Renin Ratio 0.0 - 30.0 2.7        Latest Ref Rng & Units 02/06/2022   10:01 AM  Metanephrines/Catecholamines   Metanephrines 0.0 - 88.0 pg/mL 60.2   Normetanephrines  0.0 - 285.2 pg/mL 100.6           06/12/2022   10:43 AM  Renovascular    Renal Artery [place] Completed Yes      Disposition:    FU  with MD/PharmD in 1 month    Medication Adjustments/Labs and Tests Ordered: Current medicines are reviewed at length with the patient today.  Concerns regarding medicines are outlined above.  Orders Placed This Encounter  Procedures   Cardiac Stress Test: Informed Consent Details: Physician/Practitioner Attestation; Transcribe to consent form and obtain patient signature   MYOCARDIAL PERFUSION IMAGING   No orders of the defined types were placed in this encounter.    Signed, Loel Dubonnet, NP  07/20/2022 10:37 AM    [place]

## 2022-07-20 ENCOUNTER — Ambulatory Visit (HOSPITAL_BASED_OUTPATIENT_CLINIC_OR_DEPARTMENT_OTHER): Payer: No Typology Code available for payment source | Admitting: Family

## 2022-07-20 ENCOUNTER — Encounter (HOSPITAL_BASED_OUTPATIENT_CLINIC_OR_DEPARTMENT_OTHER): Payer: Self-pay | Admitting: Family

## 2022-07-20 VITALS — BP 160/70 | HR 76 | Ht 70.0 in | Wt 167.0 lb

## 2022-07-20 DIAGNOSIS — Z8673 Personal history of transient ischemic attack (TIA), and cerebral infarction without residual deficits: Secondary | ICD-10-CM

## 2022-07-20 DIAGNOSIS — I493 Ventricular premature depolarization: Secondary | ICD-10-CM

## 2022-07-20 DIAGNOSIS — R9431 Abnormal electrocardiogram [ECG] [EKG]: Secondary | ICD-10-CM | POA: Diagnosis not present

## 2022-07-20 DIAGNOSIS — I10 Essential (primary) hypertension: Secondary | ICD-10-CM

## 2022-07-20 DIAGNOSIS — R7303 Prediabetes: Secondary | ICD-10-CM | POA: Diagnosis not present

## 2022-07-20 DIAGNOSIS — E782 Mixed hyperlipidemia: Secondary | ICD-10-CM | POA: Diagnosis not present

## 2022-07-20 LAB — BASIC METABOLIC PANEL
BUN/Creatinine Ratio: 14 (ref 10–24)
BUN: 25 mg/dL (ref 8–27)
CO2: 24 mmol/L (ref 20–29)
Calcium: 10 mg/dL (ref 8.6–10.2)
Chloride: 96 mmol/L (ref 96–106)
Creatinine, Ser: 1.8 mg/dL — ABNORMAL HIGH (ref 0.76–1.27)
Glucose: 124 mg/dL — ABNORMAL HIGH (ref 70–99)
Potassium: 3.5 mmol/L (ref 3.5–5.2)
Sodium: 138 mmol/L (ref 134–144)
eGFR: 40 mL/min/{1.73_m2} — ABNORMAL LOW (ref >59)

## 2022-07-20 NOTE — Telephone Encounter (Signed)
Attempted to contact pt; no answer. Sent my chart message

## 2022-07-20 NOTE — Patient Instructions (Addendum)
Medication Instructions:  Continue your same medications.    Labwork: Your physician recommends that you return for lab work today: BMP   Testing/Procedures: Your physician has requested that you have a lexiscan myoview. Please follow instruction sheet, as given.    Follow-Up: In 1 month with Laurann Montana, NP or Tommy Medal, [facility] in Hypertension Clinic

## 2022-07-20 NOTE — Progress Notes (Signed)
Pt also has result note. Sent my chart message to patient.

## 2022-07-27 ENCOUNTER — Telehealth (HOSPITAL_BASED_OUTPATIENT_CLINIC_OR_DEPARTMENT_OTHER): Payer: Self-pay

## 2022-07-27 NOTE — Telephone Encounter (Addendum)
Call attempted to patient, no answer, no option ot leave message.   ----- Message from Loel Dubonnet, NP sent at 07/26/2022  2:54 PM EST ----- Can we call Thursday to check in on BP please? Does not log into MyChart often. TY! ----- Message ----- From: Loel Dubonnet, NP Sent: 07/26/2022  12:00 AM EST To: Loel Dubonnet, NP  Check in BP

## 2022-07-31 NOTE — Telephone Encounter (Signed)
Unable to reach pt.

## 2022-08-02 ENCOUNTER — Encounter: Payer: Self-pay | Admitting: *Deleted

## 2022-08-03 ENCOUNTER — Other Ambulatory Visit: Payer: Self-pay

## 2022-08-03 ENCOUNTER — Telehealth: Payer: Self-pay

## 2022-08-03 DIAGNOSIS — Z8673 Personal history of transient ischemic attack (TIA), and cerebral infarction without residual deficits: Secondary | ICD-10-CM

## 2022-08-03 DIAGNOSIS — H532 Diplopia: Secondary | ICD-10-CM

## 2022-08-03 NOTE — Telephone Encounter (Signed)
New referral placed , spoke with Crystal and Loma Sousa

## 2022-08-03 NOTE — Telephone Encounter (Signed)
Call received from [facility] and states she got notification from [facility] stating that they are out of network for the upcoming eye surgery. She is requesting advise on this matter.

## 2022-08-03 NOTE — Telephone Encounter (Signed)
So-I would recommend making sure Loma Sousa work with this if for some reason this cannot be covered then please find out from [facility] which I specialist for ophthalmology would they cover for surgery? Dr.Zamora? Please also find out from [place] which eye specialist is going to be doing the surgery? Background-this patient has a detached retina that optometry recommended surgery and they referred him to the ophthalmologist.  Unfortunately this was not done by so therefore we are not familiar with who is or who is not within network. Please try to do the best he can and solving this issue between [place] Loma Sousa and the family thank you

## 2022-08-07 NOTE — Telephone Encounter (Signed)
2nd call attempt, no answer or option to leave message

## 2022-08-09 ENCOUNTER — Telehealth (HOSPITAL_COMMUNITY): Payer: Self-pay | Admitting: *Deleted

## 2022-08-09 NOTE — Telephone Encounter (Signed)
Per DPR spoke to nieceEngineering geologist) and gave instructions for MPI study on 08/11/22.

## 2022-08-11 ENCOUNTER — Ambulatory Visit (HOSPITAL_COMMUNITY): Payer: No Typology Code available for payment source | Attending: Family

## 2022-08-11 DIAGNOSIS — Z8673 Personal history of transient ischemic attack (TIA), and cerebral infarction without residual deficits: Secondary | ICD-10-CM | POA: Insufficient documentation

## 2022-08-11 DIAGNOSIS — I10 Essential (primary) hypertension: Secondary | ICD-10-CM | POA: Insufficient documentation

## 2022-08-11 DIAGNOSIS — R9431 Abnormal electrocardiogram [ECG] [EKG]: Secondary | ICD-10-CM | POA: Diagnosis not present

## 2022-08-11 LAB — MYOCARDIAL PERFUSION IMAGING
LV dias vol: 162 mL (ref 62–150)
LV sys vol: 77 mL
Nuc Stress EF: 53 %
Peak HR: 99 {beats}/min
Rest HR: 60 {beats}/min
Rest Nuclear Isotope Dose: 10.2 mCi
SDS: 1
SRS: 0
SSS: 1
ST Depression (mm): 0 mm
Stress Nuclear Isotope Dose: 30.9 mCi
TID: 1.05

## 2022-08-11 MED ORDER — REGADENOSON 0.4 MG/5ML IV SOLN
0.4000 mg | Freq: Once | INTRAVENOUS | Status: AC
  Administered 2022-08-11: 0.4 mg via INTRAVENOUS

## 2022-08-11 MED ORDER — TECHNETIUM TC 99M TETROFOSMIN IV KIT
31.6000 | PACK | Freq: Once | INTRAVENOUS | Status: AC | PRN
  Administered 2022-08-11: 31.6 via INTRAVENOUS

## 2022-08-11 MED ORDER — TECHNETIUM TC 99M TETROFOSMIN IV KIT
10.1000 | PACK | Freq: Once | INTRAVENOUS | Status: AC | PRN
  Administered 2022-08-11: 10.1 via INTRAVENOUS

## 2022-08-13 IMAGING — US US RENAL
1 series · 14 of 25 positions shown · non-contrast
Comparison: CT abdomen pelvis 02/07/2017

CLINICAL DATA: CKD

EXAM:
RENAL / URINARY TRACT ULTRASOUND COMPLETE

[Series 1: us renal · 14 of 46 slices shown]
[im 1/46]
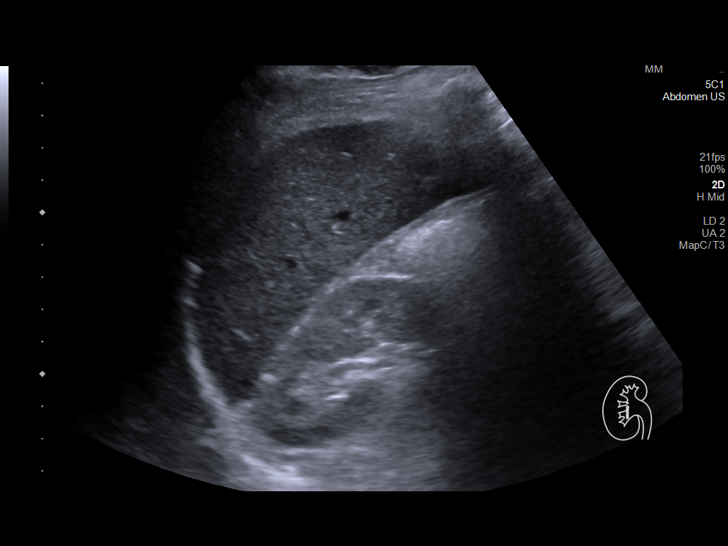
[im 4/46]
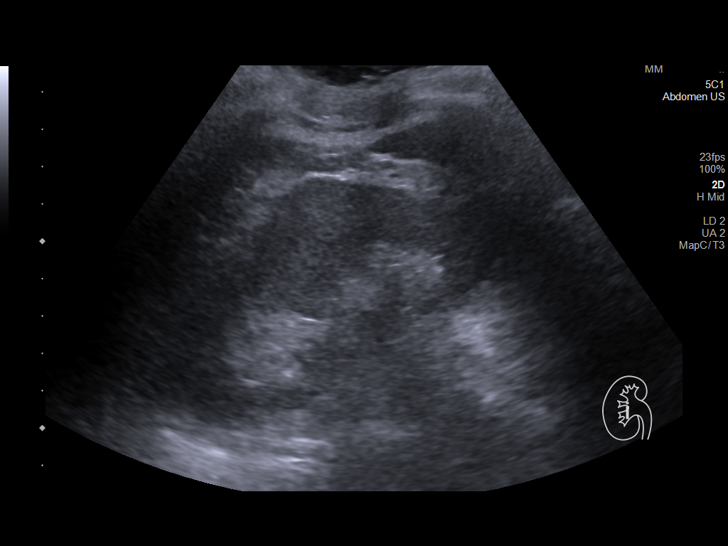
[im 8/46]
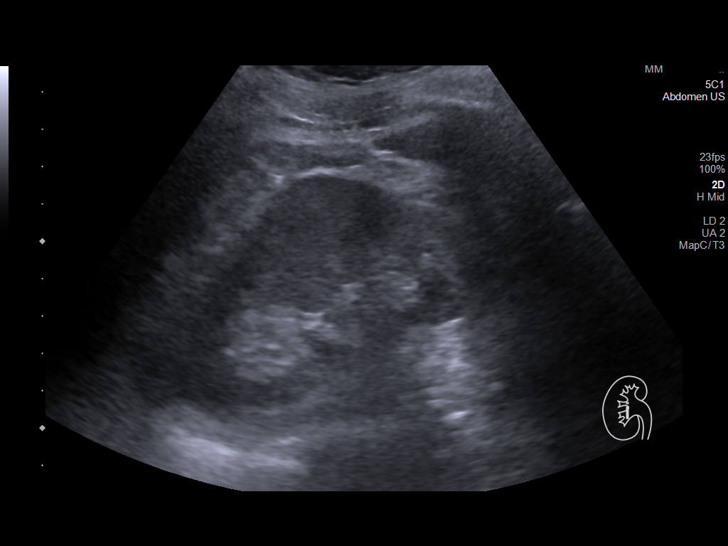
[im 12/46]
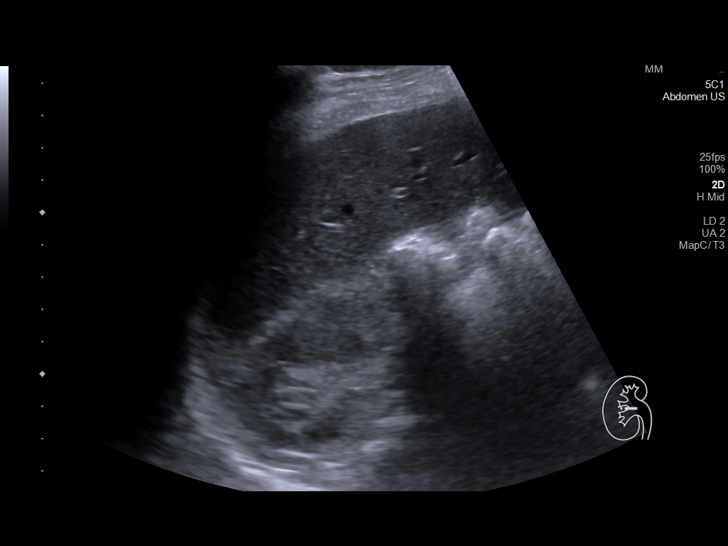
[im 16/46]
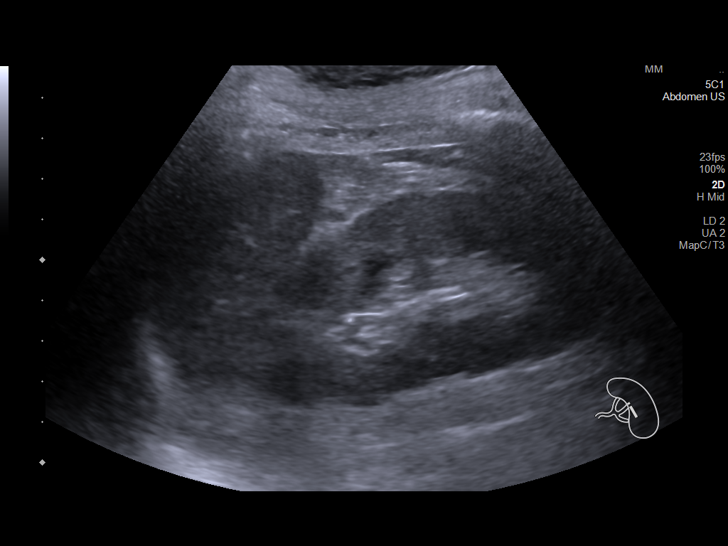
[im 17/46]
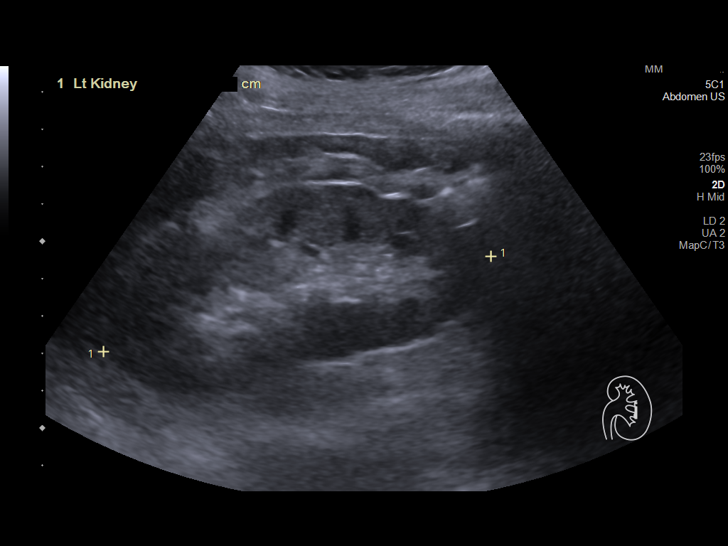
[im 21/46]
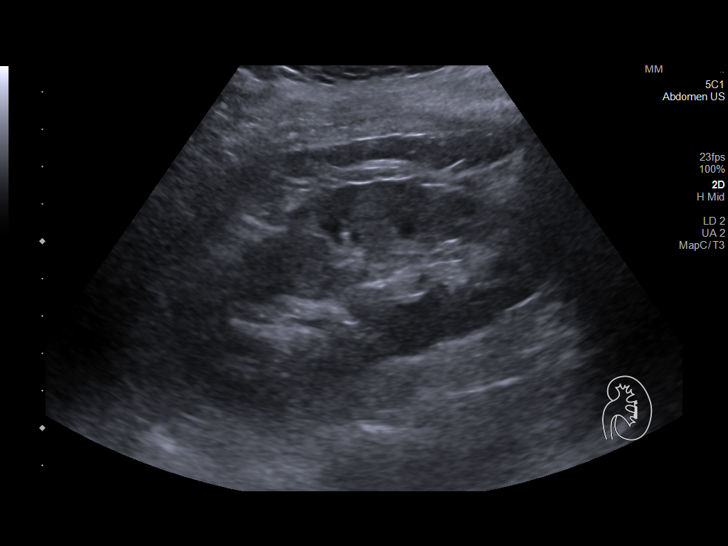
[im 25/46]
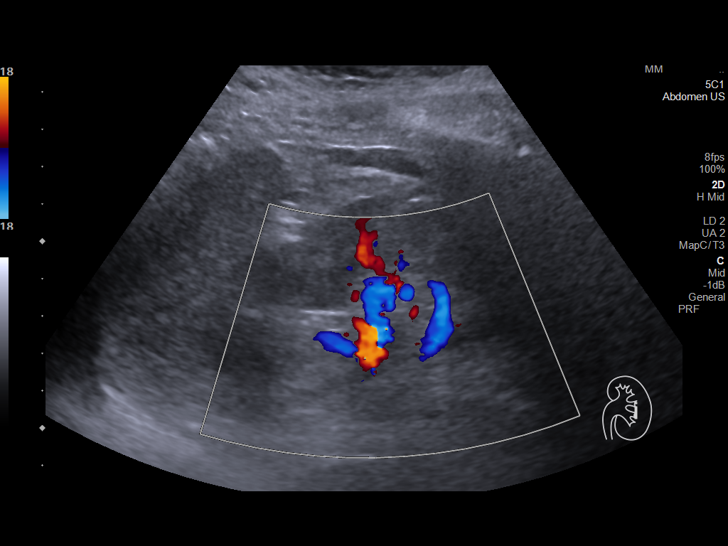
[im 29/46]
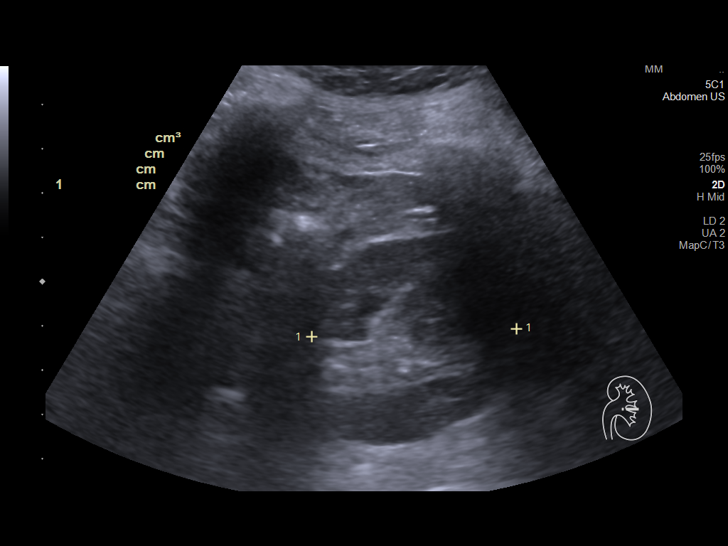
[im 31/46]
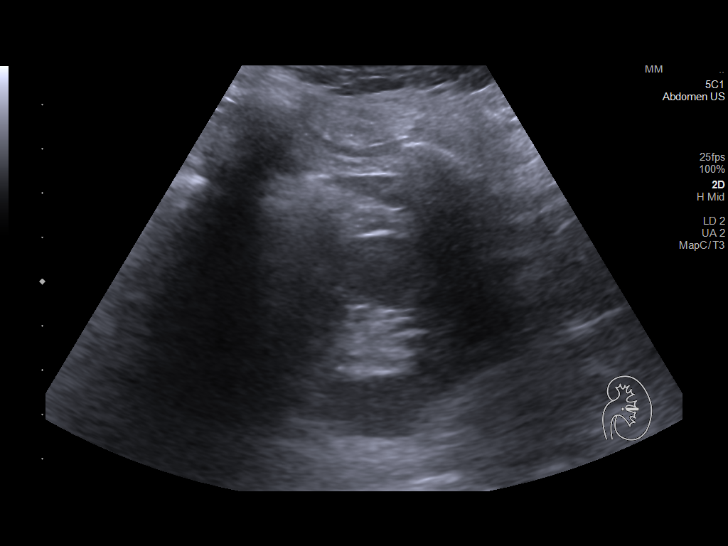
[im 34/46]
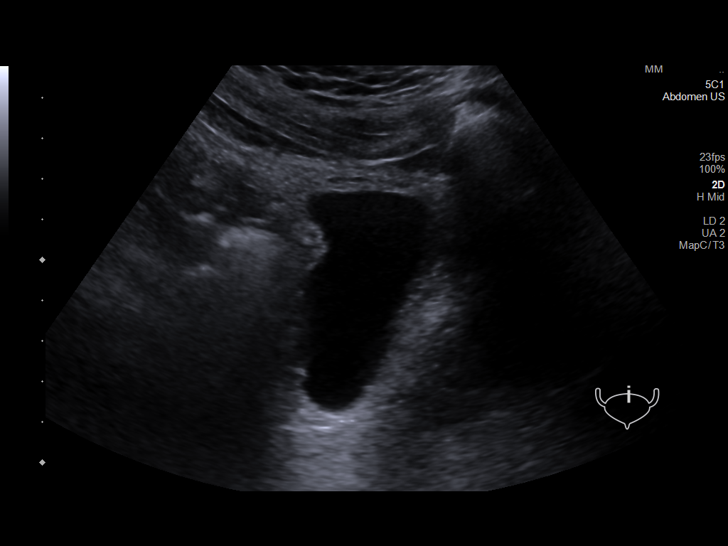
[im 38/46]
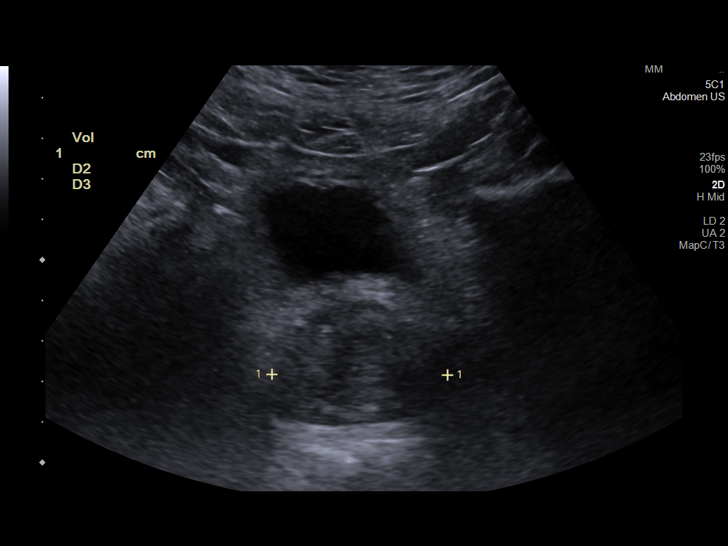
[im 42/46]
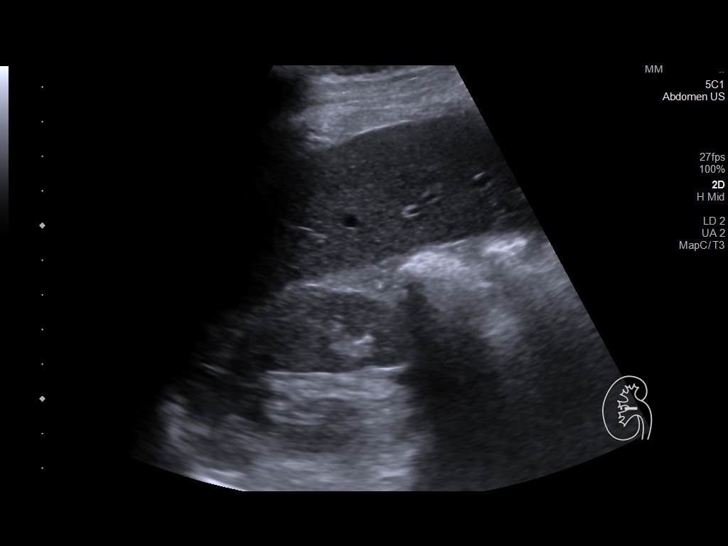
[im 46/46]
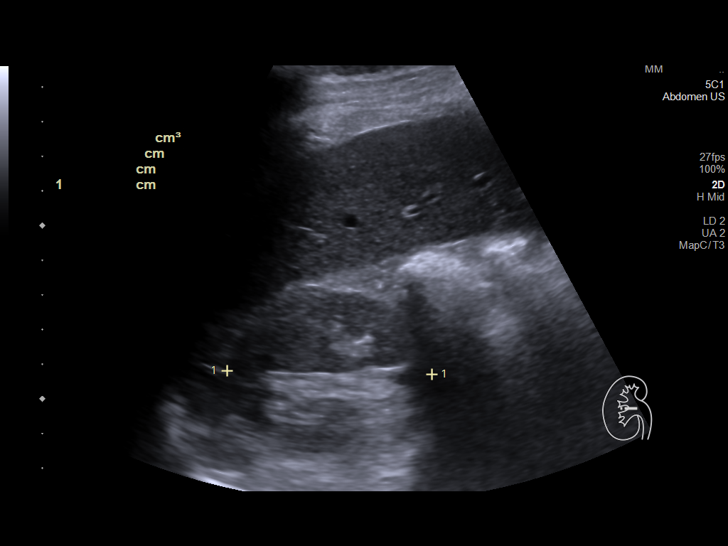

[14 of 25 positions shown; findings below may reference images not displayed]

FINDINGS: Right Kidney:

Renal measurements: 10.4 x 5.2 x 5.9 cm = volume: 168 mL.
Echogenicity within normal limits. No mass or hydronephrosis
visualized.

Left Kidney:

Renal measurements: 10.7 x 4.9 x 4.6 cm = volume: 128 mL.
Echogenicity within normal limits. No mass or hydronephrosis
visualized.

Bladder:

Appears normal for degree of bladder distention.

Other:

None.
IMPRESSION: Unremarkable sonographic appearance of the bilateral kidneys and
bladder.

## 2022-08-17 ENCOUNTER — Ambulatory Visit (HOSPITAL_BASED_OUTPATIENT_CLINIC_OR_DEPARTMENT_OTHER): Payer: No Typology Code available for payment source | Admitting: Family

## 2022-08-17 ENCOUNTER — Ambulatory Visit: Payer: No Typology Code available for payment source | Admitting: Family Medicine

## 2022-08-17 VITALS — BP 152/94 | Wt 166.4 lb

## 2022-08-17 DIAGNOSIS — R7303 Prediabetes: Secondary | ICD-10-CM | POA: Diagnosis not present

## 2022-08-17 DIAGNOSIS — H3321 Serous retinal detachment, right eye: Secondary | ICD-10-CM | POA: Diagnosis not present

## 2022-08-17 DIAGNOSIS — H532 Diplopia: Secondary | ICD-10-CM

## 2022-08-17 DIAGNOSIS — I10 Essential (primary) hypertension: Secondary | ICD-10-CM | POA: Diagnosis not present

## 2022-08-17 DIAGNOSIS — N1832 Chronic kidney disease, stage 3b: Secondary | ICD-10-CM | POA: Diagnosis not present

## 2022-08-17 NOTE — Progress Notes (Signed)
   Subjective:    Patient ID: Daryl Perkins, male    DOB: 11-Jan-1951, 72 y.o.   MRN: [id number]  HPI Patient arrives in 10 weeks for follow up on suture removal.  This patient has a detached retina unfortunately the doctor that was going to do the surgery is out of network and therefore there need to be seen by different doctor In addition to this has uncontrolled high blood pressure takes his medicine on a regular basis family tries to monitor this patient does walk every day tries to minimize salt in the diet He has had uncontrolled blood pressure for a long span of time many years compliance has been an issue up until recently    Review of Systems     Objective:   Physical Exam  Patient wearing a patch When not wearing the patch he has double vision       Assessment & Plan:   1. Double vision Referral to Dr. Coralyn Pear.  Refills was given we will send it again  2. Uncontrolled hypertension Patient taking all his medicines blood pressure on the high and sees specialist next week we will try to get him in with a nephrologist as well perhaps they will be able to help get his blood pressure further down Importance of taking medicine discussed  3. Prediabetes Portion control regular physical activity  4. Stage 3b chronic kidney disease [facility]) Referral to Dr. Theador Hawthorne.  No lab work indicated today.  Previous labs reviewed continue current medications.  May need to be on additional medicines.  Patient is seen in the hypertensive clinic but it appears they have done everything they know how to do so so therefore it would be reasonable given his elevated creatinine at 2.14 and GFR of 32 to get further evaluation by nephrology.  It should be noted that back in November 2019 creatinine was 1.43 in June 2021 creatinine was 1.73 with a GFR estimated of 40  Detached retina-needs to see Dr. Coralyn Pear for further evaluation possible surgery  We will follow-up the patient in 6 weeks

## 2022-08-18 NOTE — Progress Notes (Signed)
08/18/22-pt has Right Eye Vitrectomy scheduled for Monday 08/21/22. Contacted Dr.Bhutani office (pt last seen in 2022); left voicemail to return call to see if pt has upcoming appt.

## 2022-08-24 ENCOUNTER — Encounter (HOSPITAL_BASED_OUTPATIENT_CLINIC_OR_DEPARTMENT_OTHER): Payer: Self-pay | Admitting: Family

## 2022-08-24 ENCOUNTER — Ambulatory Visit (HOSPITAL_BASED_OUTPATIENT_CLINIC_OR_DEPARTMENT_OTHER): Payer: No Typology Code available for payment source | Admitting: Family

## 2022-08-24 VITALS — BP 170/90 | HR 91 | Ht 71.0 in | Wt 166.2 lb

## 2022-08-24 DIAGNOSIS — I700 Atherosclerosis of aorta: Secondary | ICD-10-CM | POA: Diagnosis not present

## 2022-08-24 DIAGNOSIS — E785 Hyperlipidemia, unspecified: Secondary | ICD-10-CM | POA: Diagnosis not present

## 2022-08-24 DIAGNOSIS — I493 Ventricular premature depolarization: Secondary | ICD-10-CM

## 2022-08-24 DIAGNOSIS — E782 Mixed hyperlipidemia: Secondary | ICD-10-CM | POA: Diagnosis not present

## 2022-08-24 DIAGNOSIS — I10 Essential (primary) hypertension: Secondary | ICD-10-CM

## 2022-08-24 MED ORDER — ISOSORBIDE MONONITRATE ER 60 MG PO TB24: 60.0000 mg | ORAL_TABLET | Freq: Every day | ORAL | 1 refills | Status: DC

## 2022-08-24 NOTE — Patient Instructions (Addendum)
Medication Instructions:  Your physician has recommended you make the following change in your medication:  START Isosorbide Mononitrate one 49m tablet every evening   Labwork: Your physician recommends that you return for lab work today: direct LDL, liver function panel   Testing/Procedures: None ordered today   Follow-Up: In 1 month in Advanced Hypertension Clinic with Dr. ROval Linsey  Special Instructions:  We have given you information about renal denervation today to consider.

## 2022-08-24 NOTE — Progress Notes (Signed)
Advanced Hypertension Clinic Assessment:    Date:  08/24/2022   ID:  Daryl Perkins, DOB April 15, 1951, MRN [id number]  PCP:  Kathyrn Drown, MD  Cardiologist:  None  Nephrologist:  Referring MD: Kathyrn Drown, MD   CC: Hypertension  History of Present Illness:    Daryl Perkins is a 72 y.o. male with a hx of HTN, CVA, CKD, aortic atherosclerosis, syncope here to follow up in the Advanced Hypertension Clinic. Family history of heart disease, hypertension.    Prior CT 01/2017 with aortic atherosclerosis. Prior 04/05/22 renal duplex with no stenosis and minimal iliac artery aneurysm.  Established with [facility] HTN clinic 06/08/22.  Daryl Perkins was diagnosed with hypertension in his 38s. Niece helps him with pill pack to promote compliance. He was walking for exercise. He did note a prior episode of syncope in October. Hydralazine increased to 182m AM, 516mafternoon, 10052mM. Amlodipine, Doxazosin, Indapamide, Spironolactone were continued. 14 day ZIO ordered forprior syncope as well as cardiac CTA due to TWI lateral leads. Rosuvastatin was increased due to LDL 138. Spironolactone stopped due to renal function and Hydralazine increased to 100m42mD. CTA put on hold due to renal function. He did not have repeat BMP collected.  Preliminary monitor report. With predominantly NSR average heart rate 75 bpm. 2 runs of VT longest/fastest 6 beats 185 bpm. 20 run sof SVT. PAC burden 2.5%, PVC burden 2.5%.   ED visit 07/12/22 after mechanical fall. Scans were negative. Required 1 nonabsorbable suture with recommend to follow up in one week for suture removal.   There is documentation from primary care 07/13/22 stating Dr. DunnFelix Ahmadie doctor) noted retinal detachment of R eye which would require surgery. Saw primary care 07/19/22. Doxazosin increased to 8mg 33mce daily. Suture removed.   At cardiology follow up 07/20/22 myoview ordered which was low risk study. BP 148-170. PCP had just adjusted  Doxazosin the day prior.   Presents today for follow up with his niece.  She helps him to manage his medications. Reports compliance with medications. Has been walking for exercise. Has not yet had his eye procedure as was erroneously told that facility was not in network. Blood pressure at home has been as high as 170/80 but mostly in the 150s/80s. The lowest blood pressure was 158/75.   Reports no shortness of breath nor dyspnea on exertion. Reports no chest pain, pressure, or tightness. No edema, orthopnea, PND. Reports no palpitations.    Previous antihypertensives: Losartan - renal function HCTZ - renal function Spironolactone - discontinued due to renal function   Past Medical History:  Diagnosis Date   Diabetes mellitus without complication (HCC) [place]    Hypertension    Poor dental hygiene    Prediabetes 12/09/2019    Past Surgical History:  Procedure Laterality Date   BACK SURGERY     1987 and 1989. lumbar disc   COLONOSCOPY N/A 06/09/2020   Procedure: COLONOSCOPY;  Surgeon: RourkDaneil Dolin  [facility];  Service: Endoscopy;  Laterality: N/A;  12:00   INGUINAL HERNIA REPAIR Right 09/09/2012   Procedure: HERNIA REPAIR INGUINAL ADULT;  Surgeon: Mark Jamesetta So  Location: [facility];  Service: General;  Laterality: Right;   INSERTION OF MESH Right 09/09/2012   Procedure: INSERTION OF MESH;  Surgeon: Mark Jamesetta So  Location: [facility];  Service: General;  Laterality: Right;   POLYPECTOMY  06/09/2020   Procedure: POLYPECTOMY;  Surgeon: Daneil Dolin, MD;  Location[facility];  Service: Endoscopy;;  rectal, hepatic flexure    Current Medications: Current Meds  Medication Sig   amLODipine (NORVASC) 10 MG tablet Take 1 tablet (10 mg total) by mouth daily.   aspirin EC 81 MG tablet Take 81 mg by mouth daily. Swallow whole.   doxazosin (CARDURA) 4 MG tablet Take 8 mg by mouth 2 (two) times daily.   hydrALAZINE (APRESOLINE) 100 MG tablet  Take 1 tablet (100 mg total) by mouth 3 (three) times daily. Take 1 tablet 3 times per day   indapamide (LOZOL) 2.5 MG tablet Take one tablet po daily   isosorbide mononitrate (IMDUR) 60 MG 24 hr tablet Take 1 tablet (60 mg total) by mouth daily.   rosuvastatin (CRESTOR) 20 MG tablet Take 1 tablet (20 mg total) by mouth daily.     Allergies:   Patient has no known allergies.   Social History   Socioeconomic History   Marital status: Divorced    Spouse name: Not on file   Number of children: Not on file   Years of education: Not on file   Highest education level: Not on file  Occupational History   Not on file  Tobacco Use   Smoking status: Never   Smokeless tobacco: Never  Substance and Sexual Activity   Alcohol use: No   Drug use: No   Sexual activity: Yes    Birth control/protection: None  Other Topics Concern   Not on file  Social History Narrative   Not on file   Social Determinants of Health   Financial Resource Strain: Not on file  Food Insecurity: No Food Insecurity (06/08/2022)   Hunger Vital Sign    Worried About Running Out of Food in the Last Year: Never true    Ran Out of Food in the Last Year: Never true  Transportation Needs: No Transportation Needs (06/08/2022)   PRAPARE - Hydrologist (Medical): No    Lack of Transportation (Non-Medical): No  Physical Activity: Sufficiently Active (06/08/2022)   Exercise Vital Sign    Days of Exercise per Week: 7 days    Minutes of Exercise per Session: 150+ min  Stress: Not on file  Social Connections: Not on file    Family History: The patient's family history includes Heart attack in his brother; Hypertension in his niece and sister.  ROS:   Please see the history of present illness.     All other systems reviewed and are negative.  EKGs/Labs/Other Studies Reviewed:    Myoview 08/11/22   The study is normal. The study is low risk.   No ST deviation was noted.   Left ventricular  function is abnormal. Global function is mildly reduced. There were no regional wall motion abnormalities. End diastolic cavity size is moderately enlarged. End systolic cavity size is moderately enlarged.   Prior study not available for comparison.   Moderately enlarged LV with mildly reduced LVEF without focal wall motion abnormalities. No evidence of ischemia.  Monitor 07/11/22 14 Day Zio Monitor   Quality: Fair.  Baseline artifact. Predominant rhythm: sinus rhythm Average heart rate: 75 bpm Max heart rate: 131 bpm Min heart rate: 46 bpm Pauses >2.5 seconds: none   Two episodes of NSVT up to 6 beats 20 episodes of SVT.  Longest episode 19 beats.    Occasional (2.5%) PACs  Occasional (2.5%) PVCs  Carotid duplex 07/10/22 IMPRESSION: 1. Right carotid artery system:  Patent without significant atherosclerotic plaque formation.   2. Left carotid artery system: Less than 50% stenosis secondary to mild multifocal atherosclerotic plaque formation.   3.  Vertebral artery system: Patent with antegrade flow bilaterally.  EKG:  EKG is not ordered today.    Recent Labs: 03/29/2022: TSH 3.520 06/08/2022: ALT 11 07/20/2022: BUN 25; Creatinine, Ser 1.80; Potassium 3.5; Sodium 138   Recent Lipid Panel    Component Value Date/Time   CHOL 246 (H) 01/17/2022 1042   TRIG 54 01/17/2022 1042   HDL 51 01/17/2022 1042   CHOLHDL 4.8 01/17/2022 1042   CHOLHDL 4.5 08/08/2013 1246   VLDL 24 08/08/2013 1246   LDLCALC 186 (H) 01/17/2022 1042   LDLDIRECT 138 (H) 06/08/2022 1456    Physical Exam:   VS:  BP (!) 170/90   Pulse 91   Ht 5' 11"$  (1.803 m)   Wt 166 lb 3.2 oz (75.4 kg)   SpO2 99%   BMI 23.18 kg/m  , BMI Body mass index is 23.18 kg/m. GENERAL:  Well appearing HEENT: Pupils equal round and reactive, fundi not visualized, oral mucosa unremarkable. R eye patch in place. Steri strips to left side of forehead with no bleeding.  NECK:  No jugular venous distention, waveform within normal  limits, carotid upstroke brisk and symmetric, no bruits, no thyromegaly LYMPHATICS:  No cervical adenopathy LUNGS:  Clear to auscultation bilaterally HEART:  RRR.  PMI not displaced or sustained,S1 and S2 within normal limits, no S3, no S4, no clicks, no rubs, no murmurs ABD:  Flat, positive bowel sounds normal in frequency in pitch, no bruits, no rebound, no guarding, no midline pulsatile mass, no hepatomegaly, no splenomegaly EXT:  2 plus pulses throughout, no edema, no cyanosis no clubbing SKIN:  No rashes no nodules NEURO:  Cranial nerves II through XII grossly intact, motor grossly intact throughout PSYCH:  Cognitively intact, oriented to person place and time   ASSESSMENT/PLAN:    HTN - BP not at goal of <130/80. Continue amlodipine 47m QD, Doxazosin 866mBID, Hydralazine 1008mID, Indapamide 2.5mg51mtart Imdur 60mg57m Can further up titrate at follow up. Continue to participate in Advanced Hypertension research trial. Discussed to monitor BP at home at least 2 hours after medications and sitting for 5-10 minutes.  Secondary workup: No snoring suggestive of OSA. 03/2022 TSH 3.5. 04/05/22 no renal artery stenosis and minimal iliac artery aneurysm.  Initiated discussion regarding renal denervation and provided handout to review. He is somewhat hesitant regarding procedures.   PVC / Syncope  - PVC noted on prior EKG. Monitor with predominantly NSR with runs of SVT. Asymptomatic with no palpitations nor recurrent syncope. No further workup at this time.  Abnormal EKG - EKG 12/7/23with new TWI lateral leads. 08/11/22 myoview low risk study. No anginal symptoms. EKG changes may be sequelae of poorly controlled hypertension.   Hx of CVA - Continue aspirin, Rosuvastatin.   Aortic atherosclerosis / HLD, LDL goal <70 - 12.7/23 LDL 138. Rosuvastatin increased to 20mg 23my. FLP/direct LDL today.   CKD - Careful titration of diuretic and antihypertensive.  Follows with Dr. BhutanTheador Hawthornephrology.    Screening for Secondary Hypertension:  Click here to document screening for secondary causes of HTN  :[id number] Labs/Studies:    Latest Ref Rng & Units 07/20/2022    9:55 AM 06/08/2022    2:56 PM 05/02/2022   12:21 PM  Basic Labs  Sodium 134 - 144 mmol/L 138  137  136  Potassium 3.5 - 5.2 mmol/L 3.5  3.8  3.7   Creatinine 0.76 - 1.27 mg/dL 1.80  2.14  1.77        Latest Ref Rng & Units 03/29/2022    8:47 AM  Thyroid   TSH 0.450 - 4.500 uIU/mL 3.520        Latest Ref Rng & Units 02/06/2022   10:01 AM  Renin/Aldosterone   Aldosterone 0.0 - 30.0 ng/dL 6.3   Renin 0.167 - 5.380 ng/mL/hr 2.351   Aldos/Renin Ratio 0.0 - 30.0 2.7        Latest Ref Rng & Units 02/06/2022   10:01 AM  Metanephrines/Catecholamines   Metanephrines 0.0 - 88.0 pg/mL 60.2   Normetanephrines  0.0 - 285.2 pg/mL 100.6           06/12/2022   10:43 AM  Renovascular   Renal Artery [place] Completed Yes      Disposition:    FU with MD in 1 month    Medication Adjustments/Labs and Tests Ordered: Current medicines are reviewed at length with the patient today.  Concerns regarding medicines are outlined above.  Orders Placed This Encounter  Procedures   Direct LDL   Hepatic function panel   Meds ordered this encounter  Medications   isosorbide mononitrate (IMDUR) 60 MG 24 hr tablet    Sig: Take 1 tablet (60 mg total) by mouth daily.    Dispense:  90 tablet    Refill:  1    Order Specific Question:   Supervising Provider    Answer:   Buford Dresser [id number]     Signed, Loel Dubonnet, NP  08/24/2022 3:29 PM    [place]

## 2022-08-25 LAB — HEPATIC FUNCTION PANEL
ALT: 10 IU/L (ref 0–44)
AST: 17 IU/L (ref 0–40)
Albumin: 4.2 g/dL (ref 3.9–4.9)
Alkaline Phosphatase: 111 IU/L (ref 44–121)
Bilirubin Total: 0.5 mg/dL (ref 0.0–1.2)
Bilirubin, Direct: 0.19 mg/dL (ref 0.00–0.40)
Total Protein: 7.7 g/dL (ref 6.0–8.5)

## 2022-08-25 LAB — LDL CHOLESTEROL, DIRECT: LDL Direct: 97 mg/dL (ref 0–99)

## 2022-08-28 ENCOUNTER — Other Ambulatory Visit (HOSPITAL_BASED_OUTPATIENT_CLINIC_OR_DEPARTMENT_OTHER): Payer: Self-pay | Admitting: Family

## 2022-08-28 DIAGNOSIS — R7303 Prediabetes: Secondary | ICD-10-CM

## 2022-08-28 DIAGNOSIS — I10 Essential (primary) hypertension: Secondary | ICD-10-CM

## 2022-08-28 DIAGNOSIS — E782 Mixed hyperlipidemia: Secondary | ICD-10-CM

## 2022-08-28 MED ORDER — ROSUVASTATIN CALCIUM 40 MG PO TABS: 40.0000 mg | ORAL_TABLET | Freq: Every day | ORAL | 3 refills | Status: DC

## 2022-08-28 NOTE — Progress Notes (Signed)
Message sent to referral coordinator 08/28/22

## 2022-09-12 ENCOUNTER — Ambulatory Visit: Payer: Medicare Other | Admitting: Family Medicine

## 2022-09-12 NOTE — Addendum Note (Signed)
Addended by: Dairl Ponder on: 09/12/2022 09:32 AM   Modules accepted: Orders

## 2022-09-19 ENCOUNTER — Encounter (HOSPITAL_COMMUNITY): Payer: Self-pay | Admitting: Optometry

## 2022-09-19 NOTE — Progress Notes (Signed)
PCP - Dr Sallee Lange Cardiologist - Laurann Montana, NP Nephrology - Dr Ulice Bold  Chest x-ray - n/a EKG - 06/08/22 Stress Test - 08/11/22 ECHO - n/a Cardiac Cath - n/a  ICD Pacemaker/Loop - n/a  Sleep Study -  n/a CPAP - none  Diabetes - n/a  Aspirin Instructions: Last dose of ASA was on 09/13/22.    ERAS: Clear liquids   Anesthesia review: Yes  STOP now taking any Aspirin (unless otherwise instructed by your surgeon), Aleve, Naproxen, Ibuprofen, Motrin, Advil, Goody's, BC's, all herbal medications, fish oil, and all vitamins.   Coronavirus Screening Does the patient have any of the following symptoms:  Cough yes/no: No Fever (>100.36F)  yes/no: No Runny nose yes/no: No Sore throat yes/no: No Difficulty breathing/shortness of breath  yes/no: No  Has the patient traveled in the last 14 days and where? yes/no: No  Niece Dionne Bucy verbalized understanding of instructions that were given to [employer].

## 2022-09-20 ENCOUNTER — Encounter (HOSPITAL_COMMUNITY): Payer: Self-pay | Admitting: Optometry

## 2022-09-20 NOTE — Progress Notes (Signed)
Anesthesia Chart Review: Same day workup  Follows with cardiology for history of resistant HTN, CVA, aortic atherosclerosis, syncope.  Myoview 07/2022 was low risk.  Last seen by Laurann Montana, NP in the advanced hypertension clinic on 08/22/2022.  At that time his blood pressure was not at goal on a regimen of amlodipine 10 mg daily, doxazosin 8 mg twice daily, hydralazine 100 mg 3 times daily, indapamide 2.5 mg daily.  Imdur 60 mg daily was added.  History of CKD 3b followed by nephrologist Dr. Theador Hawthorne.  Patient will need day of surgery labs and evaluation.  EKG 06/08/2022: Sinus rhythm with occasional PVCs.  Rate 85.   Myoview 08/11/22   The study is normal. The study is low risk.   No ST deviation was noted.   Left ventricular function is abnormal. Global function is mildly reduced. There were no regional wall motion abnormalities. End diastolic cavity size is moderately enlarged. End systolic cavity size is moderately enlarged.   Prior study not available for comparison.   Moderately enlarged LV with mildly reduced LVEF without focal wall motion abnormalities. No evidence of ischemia.   Monitor 07/11/22 14 Day Zio Monitor   Quality: Fair.  Baseline artifact. Predominant rhythm: sinus rhythm Average heart rate: 75 bpm Max heart rate: 131 bpm Min heart rate: 46 bpm Pauses >2.5 seconds: none   Two episodes of NSVT up to 6 beats 20 episodes of SVT.  Longest episode 19 beats.    Occasional (2.5%) PACs  Occasional (2.5%) PVCs   Carotid duplex 07/10/22 IMPRESSION: 1. Right carotid artery system: Patent without significant atherosclerotic plaque formation.   2. Left carotid artery system: Less than 50% stenosis secondary to mild multifocal atherosclerotic plaque formation.   3.  Vertebral artery system: Patent with antegrade flow bilaterally.   Wynonia Musty [facility] Short Stay Center/Anesthesiology Phone [phone] 09/20/2022 12:32 PM

## 2022-09-20 NOTE — Anesthesia Preprocedure Evaluation (Signed)
Anesthesia Evaluation  Patient identified by MRN, date of birth, ID band Patient awake    Reviewed: Allergy & Precautions, H&P , NPO status , Patient's Chart, lab work & pertinent test results  Airway Mallampati: II   Neck ROM: full    Dental   Pulmonary neg pulmonary ROS   breath sounds clear to auscultation       Cardiovascular hypertension,  Rhythm:regular Rate:Normal     Neuro/Psych CVA    GI/Hepatic   Endo/Other  diabetes    Renal/GU Renal disease     Musculoskeletal   Abdominal   Peds  Hematology  (+) Blood dyscrasia, anemia   Anesthesia Other Findings   Reproductive/Obstetrics                             Anesthesia Physical Anesthesia Plan  ASA: 3  Anesthesia Plan: General   Post-op Pain Management:    Induction: Intravenous  PONV Risk Score and Plan: 2 and Ondansetron, Dexamethasone and Treatment may vary due to age or medical condition  Airway Management Planned: Oral ETT  Additional Equipment:   Intra-op Plan:   Post-operative Plan: Extubation in OR  Informed Consent: I have reviewed the patients History and Physical, chart, labs and discussed the procedure including the risks, benefits and alternatives for the proposed anesthesia with the patient or authorized representative who has indicated his/her understanding and acceptance.     Dental advisory given  Plan Discussed with: CRNA, Anesthesiologist and Surgeon  Anesthesia Plan Comments: (PAT note by Karoline Caldwell, PA-C: Follows with cardiology for history of resistant HTN, CVA, aortic atherosclerosis, syncope.  Myoview 07/2022 was low risk.  Last seen by Laurann Montana, NP in the advanced hypertension clinic on 08/22/2022.  At that time his blood pressure was not at goal on a regimen of amlodipine 10 mg daily, doxazosin 8 mg twice daily, hydralazine 100 mg 3 times daily, indapamide 2.5 mg daily.  Imdur 60 mg daily was  added.  History of CKD 3b followed by nephrologist Dr. Theador Hawthorne.  Patient will need day of surgery labs and evaluation.  EKG 06/08/2022: Sinus rhythm with occasional PVCs.  Rate 85.   Myoview 08/11/22  The study is normal. The study is low risk.  No ST deviation was noted.  Left ventricular function is abnormal. Global function is mildly reduced. There were no regional wall motion abnormalities. End diastolic cavity size is moderately enlarged. End systolic cavity size is moderately enlarged.  Prior study not available for comparison.  Moderately enlarged LV with mildly reduced LVEF without focal wall motion abnormalities. No evidence of ischemia.  Monitor 07/11/22 14 Day Zio Monitor  Quality: Fair. Baseline artifact. Predominant rhythm: sinus rhythm Average heart rate: 75 bpm Max heart rate: 131 bpm Min heart rate: 46 bpm Pauses >2.5 seconds: none  Two episodes of NSVT up to 6 beats 20 episodes of SVT. Longest episode 19 beats.   Occasional (2.5%) PACs  Occasional (2.5%) PVCs  Carotid duplex 07/10/22 IMPRESSION: 1. Right carotid artery system: Patent without significant atherosclerotic plaque formation.  2. Left carotid artery system: Less than 50% stenosis secondary to mild multifocal atherosclerotic plaque formation.  3. Vertebral artery system: Patent with antegrade flow bilaterally.    )        Anesthesia Quick Evaluation

## 2022-09-21 ENCOUNTER — Other Ambulatory Visit: Payer: Self-pay

## 2022-09-21 ENCOUNTER — Ambulatory Visit (HOSPITAL_BASED_OUTPATIENT_CLINIC_OR_DEPARTMENT_OTHER): Payer: No Typology Code available for payment source | Admitting: Physician Assistant

## 2022-09-21 ENCOUNTER — Ambulatory Visit (HOSPITAL_COMMUNITY)
Admission: RE | Admit: 2022-09-21 | Discharge: 2022-09-21 | Disposition: A | Discharge status: Home / Self Care | Payer: No Typology Code available for payment source | Attending: Optometry | Admitting: Optometry

## 2022-09-21 ENCOUNTER — Ambulatory Visit (HOSPITAL_COMMUNITY): Payer: No Typology Code available for payment source | Admitting: Physician Assistant

## 2022-09-21 ENCOUNTER — Encounter (HOSPITAL_COMMUNITY)
Admission: RE | Disposition: A | Discharge status: Home / Self Care | Payer: Self-pay | Source: Home / Self Care | Attending: Optometry

## 2022-09-21 ENCOUNTER — Encounter (HOSPITAL_COMMUNITY): Payer: Self-pay | Admitting: Optometry

## 2022-09-21 DIAGNOSIS — D649 Anemia, unspecified: Secondary | ICD-10-CM | POA: Diagnosis not present

## 2022-09-21 DIAGNOSIS — H3321 Serous retinal detachment, right eye: Secondary | ICD-10-CM

## 2022-09-21 DIAGNOSIS — I700 Atherosclerosis of aorta: Secondary | ICD-10-CM | POA: Diagnosis not present

## 2022-09-21 DIAGNOSIS — Z79899 Other long term (current) drug therapy: Secondary | ICD-10-CM | POA: Diagnosis not present

## 2022-09-21 DIAGNOSIS — I129 Hypertensive chronic kidney disease with stage 1 through stage 4 chronic kidney disease, or unspecified chronic kidney disease: Secondary | ICD-10-CM | POA: Insufficient documentation

## 2022-09-21 DIAGNOSIS — Z7982 Long term (current) use of aspirin: Secondary | ICD-10-CM | POA: Diagnosis not present

## 2022-09-21 DIAGNOSIS — E785 Hyperlipidemia, unspecified: Secondary | ICD-10-CM | POA: Insufficient documentation

## 2022-09-21 DIAGNOSIS — N1832 Chronic kidney disease, stage 3b: Secondary | ICD-10-CM | POA: Insufficient documentation

## 2022-09-21 DIAGNOSIS — E119 Type 2 diabetes mellitus without complications: Secondary | ICD-10-CM | POA: Diagnosis not present

## 2022-09-21 DIAGNOSIS — Z8673 Personal history of transient ischemic attack (TIA), and cerebral infarction without residual deficits: Secondary | ICD-10-CM

## 2022-09-21 DIAGNOSIS — I10 Essential (primary) hypertension: Secondary | ICD-10-CM | POA: Diagnosis not present

## 2022-09-21 DIAGNOSIS — H3341 Traction detachment of retina, right eye: Secondary | ICD-10-CM | POA: Diagnosis not present

## 2022-09-21 DIAGNOSIS — H35371 Puckering of macula, right eye: Secondary | ICD-10-CM | POA: Diagnosis not present

## 2022-09-21 HISTORY — PX: PARS PLANA VITRECTOMY: SHX2166

## 2022-09-21 HISTORY — PX: AIR/FLUID EXCHANGE: SHX6494

## 2022-09-21 HISTORY — DX: Prediabetes: R73.03

## 2022-09-21 HISTORY — DX: Hyperlipidemia, unspecified: E78.5

## 2022-09-21 HISTORY — PX: PHOTOCOAGULATION WITH LASER: SHX6027

## 2022-09-21 HISTORY — DX: Chronic kidney disease, unspecified: N18.9

## 2022-09-21 HISTORY — DX: Cerebral infarction, unspecified: I63.9

## 2022-09-21 HISTORY — PX: INJECTION OF SILICONE OIL: SHX6422

## 2022-09-21 HISTORY — PX: MEMBRANE PEEL: SHX5967

## 2022-09-21 LAB — BASIC METABOLIC PANEL
Anion gap: 15 (ref 5–15)
BUN: 24 mg/dL — ABNORMAL HIGH (ref 8–23)
CO2: 24 mmol/L (ref 22–32)
Calcium: 8.9 mg/dL (ref 8.9–10.3)
Chloride: 98 mmol/L (ref 98–111)
Creatinine, Ser: 1.89 mg/dL — ABNORMAL HIGH (ref 0.61–1.24)
GFR, Estimated: 38 mL/min — ABNORMAL LOW (ref >60)
Glucose, Bld: 123 mg/dL — ABNORMAL HIGH (ref 70–99)
Potassium: 2.8 mmol/L — ABNORMAL LOW (ref 3.5–5.1)
Sodium: 137 mmol/L (ref 135–145)

## 2022-09-21 LAB — CBC
HCT: 29.7 % — ABNORMAL LOW (ref 39.0–52.0)
Hemoglobin: 9.9 g/dL — ABNORMAL LOW (ref 13.0–17.0)
MCH: 28.4 pg (ref 26.0–34.0)
MCHC: 33.3 g/dL (ref 30.0–36.0)
MCV: 85.1 fL (ref 80.0–100.0)
Platelets: 288 10*3/uL (ref 150–400)
RBC: 3.49 MIL/uL — ABNORMAL LOW (ref 4.22–5.81)
RDW: 14.6 % (ref 11.5–15.5)
WBC: 6.9 10*3/uL (ref 4.0–10.5)
nRBC: 0 % (ref 0.0–0.2)

## 2022-09-21 SURGERY — PARS PLANA VITRECTOMY WITH 25 GAUGE
Anesthesia: General | Site: Eye | Laterality: Right

## 2022-09-21 MED ORDER — FENTANYL CITRATE (PF) 250 MCG/5ML IJ SOLN
INTRAMUSCULAR | Status: AC
  Filled 2022-09-21: qty 5

## 2022-09-21 MED ORDER — PROPOFOL 10 MG/ML IV BOLUS
INTRAVENOUS | Status: DC | PRN
  Administered 2022-09-21: 20 mg via INTRAVENOUS

## 2022-09-21 MED ORDER — PHENYLEPHRINE 80 MCG/ML (10ML) SYRINGE FOR IV PUSH (FOR BLOOD PRESSURE SUPPORT)
PREFILLED_SYRINGE | INTRAVENOUS | Status: AC
  Filled 2022-09-21: qty 10

## 2022-09-21 MED ORDER — BRILLIANT BLUE G 0.025 % IO SOSY
0.5000 mL | PREFILLED_SYRINGE | INTRAOCULAR | Status: DC
  Filled 2022-09-21: qty 0.5

## 2022-09-21 MED ORDER — NEOMYCIN-POLYMYXIN-DEXAMETH 3.5-10000-0.1 OP OINT
TOPICAL_OINTMENT | OPHTHALMIC | Status: AC
  Filled 2022-09-21: qty 3.5

## 2022-09-21 MED ORDER — LIDOCAINE HCL (PF) 2 % IJ SOLN
INTRAMUSCULAR | Status: DC | PRN
  Administered 2022-09-21: 9 mL via INTRAMUSCULAR

## 2022-09-21 MED ORDER — ORAL CARE MOUTH RINSE: 15.0000 mL | Freq: Once | OROMUCOSAL | Status: AC

## 2022-09-21 MED ORDER — LACTATED RINGERS IV SOLN: INTRAVENOUS | Status: DC

## 2022-09-21 MED ORDER — HYDRALAZINE HCL 20 MG/ML IJ SOLN
10.0000 mg | Freq: Once | INTRAMUSCULAR | Status: AC
  Administered 2022-09-21: 10 mg via INTRAVENOUS

## 2022-09-21 MED ORDER — NA CHONDROIT SULF-NA HYALURON 40-30 MG/ML IO SOSY
INTRAOCULAR | Status: DC | PRN
  Administered 2022-09-21: .5 mL via INTRAOCULAR

## 2022-09-21 MED ORDER — LIDOCAINE 2% (20 MG/ML) 5 ML SYRINGE
INTRAMUSCULAR | Status: AC
  Filled 2022-09-21: qty 5

## 2022-09-21 MED ORDER — EPHEDRINE 5 MG/ML INJ
INTRAVENOUS | Status: AC
  Filled 2022-09-21: qty 5

## 2022-09-21 MED ORDER — ATROPINE SULFATE 1 % OP SOLN
1.0000 [drp] | OPHTHALMIC | Status: AC
  Administered 2022-09-21 (×3): 1 [drp] via OPHTHALMIC
  Filled 2022-09-21: qty 2

## 2022-09-21 MED ORDER — BUPIVACAINE HCL (PF) 0.75 % IJ SOLN
INTRAMUSCULAR | Status: AC
  Filled 2022-09-21: qty 10

## 2022-09-21 MED ORDER — BRILLIANT BLUE G 0.025 % IO SOSY
PREFILLED_SYRINGE | INTRAOCULAR | Status: DC | PRN
  Administered 2022-09-21: .5 mL via INTRAVITREAL

## 2022-09-21 MED ORDER — ROCURONIUM BROMIDE 10 MG/ML (PF) SYRINGE
PREFILLED_SYRINGE | INTRAVENOUS | Status: AC
  Filled 2022-09-21: qty 10

## 2022-09-21 MED ORDER — DEXAMETHASONE SODIUM PHOSPHATE 10 MG/ML IJ SOLN
INTRAMUSCULAR | Status: AC
  Filled 2022-09-21: qty 1

## 2022-09-21 MED ORDER — ONDANSETRON HCL 4 MG/2ML IJ SOLN: 4.0000 mg | Freq: Four times a day (QID) | INTRAMUSCULAR | Status: DC | PRN

## 2022-09-21 MED ORDER — BSS PLUS IO SOLN
INTRAOCULAR | Status: AC
  Filled 2022-09-21: qty 500

## 2022-09-21 MED ORDER — EPINEPHRINE PF 1 MG/ML IJ SOLN
INTRAMUSCULAR | Status: AC
  Filled 2022-09-21: qty 1

## 2022-09-21 MED ORDER — MIDAZOLAM HCL 5 MG/5ML IJ SOLN
INTRAMUSCULAR | Status: DC | PRN
  Administered 2022-09-21: 1 mg via INTRAVENOUS

## 2022-09-21 MED ORDER — DEXAMETHASONE SODIUM PHOSPHATE 10 MG/ML IJ SOLN
INTRAMUSCULAR | Status: DC | PRN
  Administered 2022-09-21: .5 mL

## 2022-09-21 MED ORDER — TETRACAINE HCL 0.5 % OP SOLN
1.0000 [drp] | OPHTHALMIC | Status: AC
  Administered 2022-09-21 (×3): 1 [drp] via OPHTHALMIC
  Filled 2022-09-21: qty 4

## 2022-09-21 MED ORDER — SODIUM HYALURONATE 10 MG/ML IO SOLUTION
PREFILLED_SYRINGE | INTRAOCULAR | Status: AC
  Filled 2022-09-21: qty 0.85

## 2022-09-21 MED ORDER — HYDRALAZINE HCL 20 MG/ML IJ SOLN
INTRAMUSCULAR | Status: AC
  Filled 2022-09-21: qty 1

## 2022-09-21 MED ORDER — TETRACAINE HCL 0.5 % OP SOLN
OPHTHALMIC | Status: DC | PRN
  Administered 2022-09-21: 2 [drp] via OPHTHALMIC

## 2022-09-21 MED ORDER — FENTANYL CITRATE (PF) 100 MCG/2ML IJ SOLN: 25.0000 ug | INTRAMUSCULAR | Status: DC | PRN

## 2022-09-21 MED ORDER — BSS IO SOLN
INTRAOCULAR | Status: DC | PRN
  Administered 2022-09-21: 15 mL

## 2022-09-21 MED ORDER — BSS IO SOLN
INTRAOCULAR | Status: AC
  Filled 2022-09-21: qty 15

## 2022-09-21 MED ORDER — OXYCODONE HCL 5 MG/5ML PO SOLN: 5.0000 mg | Freq: Once | ORAL | Status: DC | PRN

## 2022-09-21 MED ORDER — TRIAMCINOLONE ACETONIDE 40 MG/ML IJ SUSP
INTRAMUSCULAR | Status: AC
  Filled 2022-09-21: qty 5

## 2022-09-21 MED ORDER — PROPOFOL 1000 MG/100ML IV EMUL
INTRAVENOUS | Status: AC
  Filled 2022-09-21: qty 100

## 2022-09-21 MED ORDER — MIDAZOLAM HCL 2 MG/2ML IJ SOLN
INTRAMUSCULAR | Status: AC
  Filled 2022-09-21: qty 2

## 2022-09-21 MED ORDER — PROPOFOL 10 MG/ML IV BOLUS
INTRAVENOUS | Status: AC
  Filled 2022-09-21: qty 20

## 2022-09-21 MED ORDER — NEOMYCIN-POLYMYXIN-DEXAMETH 3.5-10000-0.1 OP OINT
TOPICAL_OINTMENT | OPHTHALMIC | Status: DC | PRN
  Administered 2022-09-21: 1 via OPHTHALMIC

## 2022-09-21 MED ORDER — LIDOCAINE HCL (PF) 2 % IJ SOLN
INTRAMUSCULAR | Status: AC
  Filled 2022-09-21: qty 10

## 2022-09-21 MED ORDER — POTASSIUM CHLORIDE 10 MEQ/100ML IV SOLN
10.0000 meq | INTRAVENOUS | Status: AC
  Administered 2022-09-21 (×2): 10 meq via INTRAVENOUS
  Filled 2022-09-21 (×2): qty 100

## 2022-09-21 MED ORDER — CEFAZOLIN SUBCONJUNCTIVAL INJECTION 100 MG/0.5 ML
100.0000 mg | INJECTION | SUBCONJUNCTIVAL | Status: AC
  Administered 2022-09-21: 100 mg via SUBCONJUNCTIVAL
  Filled 2022-09-21: qty 1

## 2022-09-21 MED ORDER — BACITRACIN-POLYMYXIN B 500-10000 UNIT/GM OP OINT
TOPICAL_OINTMENT | OPHTHALMIC | Status: AC
  Filled 2022-09-21: qty 3.5

## 2022-09-21 MED ORDER — CHLORHEXIDINE GLUCONATE 0.12 % MT SOLN
15.0000 mL | Freq: Once | OROMUCOSAL | Status: AC
  Administered 2022-09-21: 15 mL via OROMUCOSAL
  Filled 2022-09-21: qty 15

## 2022-09-21 MED ORDER — TETRACAINE HCL 0.5 % OP SOLN
OPHTHALMIC | Status: AC
  Filled 2022-09-21: qty 4

## 2022-09-21 MED ORDER — TRIAMCINOLONE ACETONIDE 40 MG/ML IJ SUSP
INTRAMUSCULAR | Status: DC | PRN
  Administered 2022-09-21: 40 mg

## 2022-09-21 MED ORDER — OXYCODONE HCL 5 MG PO TABS: 5.0000 mg | ORAL_TABLET | Freq: Once | ORAL | Status: DC | PRN

## 2022-09-21 MED ORDER — NA CHONDROIT SULF-NA HYALURON 40-30 MG/ML IO SOSY
INTRAOCULAR | Status: AC
  Filled 2022-09-21: qty 0.5

## 2022-09-21 MED ORDER — TOBRAMYCIN-DEXAMETHASONE 0.3-0.1 % OP OINT
TOPICAL_OINTMENT | OPHTHALMIC | Status: AC
  Filled 2022-09-21: qty 3.5

## 2022-09-21 MED ORDER — PHENYLEPHRINE HCL 2.5 % OP SOLN
1.0000 [drp] | OPHTHALMIC | Status: AC
  Administered 2022-09-21 (×3): 1 [drp] via OPHTHALMIC
  Filled 2022-09-21: qty 2

## 2022-09-21 MED ORDER — EPINEPHRINE PF 1 MG/ML IJ SOLN
INTRAOCULAR | Status: DC | PRN
  Administered 2022-09-21: 500.3 mL

## 2022-09-21 SURGICAL SUPPLY — 50 items
APL SWBSTK 6 STRL LF DISP (MISCELLANEOUS) ×2
APPLICATOR COTTON TIP 6 STRL (MISCELLANEOUS) ×5 IMPLANT
APPLICATOR COTTON TIP 6IN STRL (MISCELLANEOUS) ×2
BNDG EYE OVAL 2 1/8 X 2 5/8 (GAUZE/BANDAGES/DRESSINGS) ×3 IMPLANT
CABLE BIPOLOR RESECTION CORD (MISCELLANEOUS) ×2 IMPLANT
CANNULA DUALBORE 25G (CANNULA) ×1 IMPLANT
CANNULA VLV SOFT TIP 25G (OPHTHALMIC) IMPLANT
CANNULA VLV SOFT TIP 25GA (OPHTHALMIC) ×2 IMPLANT
CLSR STERI-STRIP ANTIMIC 1/2X4 (GAUZE/BANDAGES/DRESSINGS) ×2 IMPLANT
DRAPE INCISE 51X51 W/FILM STRL (DRAPES) ×2 IMPLANT
DRAPE OPHTHALMIC 77X100 STRL (CUSTOM PROCEDURE TRAY) ×2 IMPLANT
FORCEPS GRIESHABER ILM 25G A (INSTRUMENTS) ×1 IMPLANT
FORCEPS GRIESHABER MAX 25G (MISCELLANEOUS) ×1 IMPLANT
GLOVE BIOGEL PI IND STRL 6.5 (GLOVE) ×3 IMPLANT
GLOVE SS BIOGEL STRL SZ 7.5 (GLOVE) ×1 IMPLANT
GOWN STRL REUS W/ TWL LRG LVL3 (GOWN DISPOSABLE) ×3 IMPLANT
GOWN STRL REUS W/ TWL XL LVL3 (GOWN DISPOSABLE) ×2 IMPLANT
GOWN STRL REUS W/TWL LRG LVL3 (GOWN DISPOSABLE) ×2
GOWN STRL REUS W/TWL XL LVL3 (GOWN DISPOSABLE) ×2
KIT BASIN OR (CUSTOM PROCEDURE TRAY) ×2 IMPLANT
KIT PERFLUORON PROCEDURE 5ML (MISCELLANEOUS) IMPLANT
LENS BIOM SUPER VIEW SET DISP (MISCELLANEOUS) ×1 IMPLANT
LENS VITRECTOMY FLAT OCLR DISP (MISCELLANEOUS) ×1 IMPLANT
NDL 18GX1X1/2 (RX/OR ONLY) (NEEDLE) ×3 IMPLANT
NDL 27GX1/2 REG BEVEL ECLIP (NEEDLE) IMPLANT
NDL FILTER BLUNT 18X1 1/2 (NEEDLE) ×1 IMPLANT
NDL HYPO 30X.5 LL (NEEDLE) ×2 IMPLANT
NEEDLE 18GX1X1/2 (RX/OR ONLY) (NEEDLE) ×6 IMPLANT
NEEDLE 27GX1/2 REG BEVEL ECLIP (NEEDLE) ×4 IMPLANT
NEEDLE FILTER BLUNT 18X1 1/2 (NEEDLE) ×2 IMPLANT
NEEDLE HYPO 30X.5 LL (NEEDLE) ×4 IMPLANT
OIL SILICONE OPHTHALMIC 1000 (Ophthalmic Related) ×1 IMPLANT
PACK VITRECTOMY CUSTOM (CUSTOM PROCEDURE TRAY) ×2 IMPLANT
PAD ARMBOARD 7.5X6 YLW CONV (MISCELLANEOUS) ×4 IMPLANT
PAK PIK VITRECTOMY CVS 25GA (OPHTHALMIC) ×2 IMPLANT
PROBE ENDO DIATHERMY 25G (MISCELLANEOUS) ×1 IMPLANT
PROBE LASER ILLUM FLEX CVD 25G (OPHTHALMIC) ×1 IMPLANT
SET INJECTOR OIL FLUID CONSTEL (OPHTHALMIC) ×1 IMPLANT
SHIELD EYE LENSE ONLY DISP (GAUZE/BANDAGES/DRESSINGS) ×1 IMPLANT
SUT PLAIN 6 0 TG1408 (SUTURE) ×1 IMPLANT
SUT VICRYL 7 0 TG140 8 (SUTURE) ×2 IMPLANT
SYR 10ML LL (SYRINGE) ×2 IMPLANT
SYR 20ML LL LF (SYRINGE) ×2 IMPLANT
SYR 5ML LL (SYRINGE) ×2 IMPLANT
SYR TB 1ML LUER SLIP (SYRINGE) ×4 IMPLANT
TAPE SURG TRANSPORE 1 IN (GAUZE/BANDAGES/DRESSINGS) ×1 IMPLANT
TAPE SURGICAL TRANSPORE 1 IN (GAUZE/BANDAGES/DRESSINGS) ×2
TOWEL GREEN STERILE FF (TOWEL DISPOSABLE) ×2 IMPLANT
TUBING HIGH PRESS EXTEN 6IN (TUBING) ×2 IMPLANT
WATER STERILE IRR 1000ML POUR (IV SOLUTION) ×2 IMPLANT

## 2022-09-21 NOTE — Discharge Instructions (Signed)
Keep patch on until tomorrow  [place].  OKAY TO SLEEP ON LEFT SIDE AT NIGHT  FOLLOW UP TOMORROW IN CLINIC

## 2022-09-21 NOTE — Anesthesia Procedure Notes (Signed)
Procedure Name: MAC Date/Time: 09/21/2022 7:40 AM  Performed by: Darletta Moll, CRNAPre-anesthesia Checklist: Patient identified, Emergency Drugs available, Suction available and Patient being monitored Patient Re-evaluated:Patient Re-evaluated prior to induction Oxygen Delivery Method: Nasal cannula

## 2022-09-21 NOTE — H&P (Signed)
Daryl Perkins is an 72 y.o. male.   Chief Complaint: retinal detachment right eye HPI: retinal detachment right eye   Past Medical History:  Diagnosis Date   Chronic kidney disease    HLD (hyperlipidemia)    HOH (hard of hearing)    does not wear hearing aids   Hypertension    Poor dental hygiene    Pre-diabetes    no meds, diet controlled   Prediabetes 12/09/2019   Stroke [facility])     Past Surgical History:  Procedure Laterality Date   BACK SURGERY     1987 and 1989. lumbar disc   COLONOSCOPY N/A 06/09/2020   Procedure: COLONOSCOPY;  Surgeon: Daneil Dolin, MD;  [facility];  Service: Endoscopy;  Laterality: N/A;  12:00   INGUINAL HERNIA REPAIR Right 09/09/2012   Procedure: HERNIA REPAIR INGUINAL ADULT;  Surgeon: Jamesetta So, MD;  Location: [facility];  Service: General;  Laterality: Right;   INSERTION OF MESH Right 09/09/2012   Procedure: INSERTION OF MESH;  Surgeon: Jamesetta So, MD;  Location: [facility];  Service: General;  Laterality: Right;   POLYPECTOMY  06/09/2020   Procedure: POLYPECTOMY;  Surgeon: Daneil Dolin, MD;  [facility];  Service: Endoscopy;;  rectal, hepatic flexure    Family History  Problem Relation Age of Onset   Hypertension Sister    Heart attack Brother    Hypertension Niece    Social History:  reports that he has never smoked. He has never used smokeless tobacco. He reports that he does not drink alcohol and does not use drugs.  Allergies: No Known Allergies  Medications Prior to Admission  Medication Sig Dispense Refill   amLODipine (NORVASC) 10 MG tablet Take 1 tablet (10 mg total) by mouth daily. (Patient taking differently: Take 10 mg by mouth at bedtime.) 90 tablet 1   doxazosin (CARDURA) 4 MG tablet Take 8 mg by mouth 2 (two) times daily.     hydrALAZINE (APRESOLINE) 100 MG tablet Take 1 tablet (100 mg total) by mouth 3 (three) times daily. Take 1 tablet 3 times per day 270 tablet 3   indapamide (LOZOL) 2.5 MG tablet  Take one tablet po daily 90 tablet 1   isosorbide mononitrate (IMDUR) 60 MG 24 hr tablet Take 1 tablet (60 mg total) by mouth daily. 90 tablet 1   rosuvastatin (CRESTOR) 40 MG tablet Take 1 tablet (40 mg total) by mouth daily. 90 tablet 3   aspirin EC 81 MG tablet Take 81 mg by mouth daily. Swallow whole.      Results for orders placed or performed during the hospital encounter of 09/21/22 (from the past 48 hour(s))  CBC per protocol     Status: Abnormal   Collection Time: 09/21/22  6:21 AM  Result Value Ref Range   WBC 6.9 4.0 - 10.5 K/uL   RBC 3.49 (L) 4.22 - 5.81 MIL/uL   Hemoglobin 9.9 (L) 13.0 - 17.0 g/dL   HCT 29.7 (L) 39.0 - 52.0 %   MCV 85.1 80.0 - 100.0 fL   MCH 28.4 26.0 - 34.0 pg   MCHC 33.3 30.0 - 36.0 g/dL   RDW 14.6 11.5 - 15.5 %   Platelets 288 150 - 400 K/uL   nRBC 0.0 0.0 - 0.2 %    Comment: Performed at [facility], [place] [address] [place], [place] [address]   No results found.  Review of Systems  Blood pressure (!) 173/79, pulse  94, temperature 98.3 F (36.8 C), temperature source Oral, resp. rate 16, height 5\' 11"  (1.803 m), weight 77.1 kg, SpO2 96 %. Physical Exam   Assessment/Plan Pars plana vitrectomy, endolaser, membrane peel, oil, RIGHT eye  Evon Slack, MD 09/21/2022, 6:58 AM

## 2022-09-21 NOTE — Transfer of Care (Signed)
Immediate Anesthesia Transfer of Care Note  Patient: Daryl Perkins  Procedure(s) Performed: TWENTY-FIVE GAUGE PARS PLANA VITRECTOMY (Right: Eye) INJECTION OF SILICONE OIL (Right: Eye) MEMBRANE PEEL (Right: Eye) PHOTOCOAGULATION WITH LASER (Right: Eye) AIR/FLUID EXCHANGE (Right: Eye)  Patient Location: PACU  Anesthesia Type:MAC  Level of Consciousness: awake, alert , oriented, and patient cooperative  Airway & Oxygen Therapy: Patient Spontanous Breathing  Post-op Assessment: Report given to RN, Post -op Vital signs reviewed and stable, and Patient moving all extremities X 4  Post vital signs: Reviewed and stable  Last Vitals:  Vitals Value Taken Time  BP 177/83 09/21/22 0910  Temp    Pulse 63 09/21/22 0915  Resp 16 09/21/22 0915  SpO2 99 % 09/21/22 0915  Vitals shown include unvalidated device data.  Last Pain:  Vitals:   09/21/22 0630  TempSrc:   PainSc: 0-No pain         Complications: No notable events documented.

## 2022-09-21 NOTE — Op Note (Signed)
Patient: Daryl Perkins  Date: 09/21/22  Pre op diagnosis: complex retinal detachment right eye  Post op diagnosis: same  Procedure:  Pars plana vitrectomy, endolaser, membrane peel, silicone oil right eye [id number])  Procedure note:  The patient was brought back to the operating room after informed consent.  The eye was prepped and draped and MAC anesthesia was induced. A lid speculum was placed.   A conjunctival cut down was performed and a peribulbar block was administed with lidocaine, marcaine and wydase.   Trochars were then placed and the infusion connected.   A core vitrectomy was performed.   Dilute kenalog was injected to stain vitreous and scleral depression was performed.  There was PVR posteriorly and inferotemporally.  Tissue blue was injected.  A contact was placed. Blue was removed with the cutter.  ILM and max grip forceps were used to removed the ERM/ PVR posteriorly. The ILM was peeled out peripherally as much as able.   The retina relaxed nicely.  Scleral depression revealed a chronic tear at 8 ockl.   A drainage retinotomy was marked with diathermy.   A soft tip was used to drain the detachment through the DR.  Laser was applied to the DR and tear.  Silicone oil was injected.  6-0 gut was used to close the sclerotomies.   Subconjunctival antibiotic and steroid were injected.   Complications: none

## 2022-09-22 ENCOUNTER — Encounter (HOSPITAL_COMMUNITY): Payer: Self-pay | Admitting: Optometry

## 2022-09-22 NOTE — Anesthesia Postprocedure Evaluation (Signed)
Anesthesia Post Note  Patient: Daryl Perkins  Procedure(s) Performed: TWENTY-FIVE GAUGE PARS PLANA VITRECTOMY (Right: Eye) INJECTION OF SILICONE OIL (Right: Eye) MEMBRANE PEEL (Right: Eye) PHOTOCOAGULATION WITH LASER (Right: Eye) AIR/FLUID EXCHANGE (Right: Eye)     Patient location during evaluation: PACU Anesthesia Type: General Level of consciousness: awake and alert Pain management: pain level controlled Vital Signs Assessment: post-procedure vital signs reviewed and stable Respiratory status: spontaneous breathing, nonlabored ventilation, respiratory function stable and patient connected to nasal cannula oxygen Cardiovascular status: blood pressure returned to baseline and stable Postop Assessment: no apparent nausea or vomiting Anesthetic complications: no   No notable events documented.  Last Vitals:  Vitals:   09/21/22 1006 09/21/22 1015  BP: (!) 179/84 (!) 167/81  Pulse: 70 61  Resp: (!) 22 19  Temp:  36.5 C  SpO2: 99% 99%    Last Pain:  Vitals:   09/21/22 1015  TempSrc:   PainSc: 0-No pain                 [place] Natarajan S

## 2022-09-28 DIAGNOSIS — T85398A Other mechanical complication of other ocular prosthetic devices, implants and grafts, initial encounter: Secondary | ICD-10-CM | POA: Diagnosis not present

## 2022-09-28 DIAGNOSIS — Z9889 Other specified postprocedural states: Secondary | ICD-10-CM | POA: Diagnosis not present

## 2022-09-28 DIAGNOSIS — H35371 Puckering of macula, right eye: Secondary | ICD-10-CM | POA: Diagnosis not present

## 2022-09-28 DIAGNOSIS — H3341 Traction detachment of retina, right eye: Secondary | ICD-10-CM | POA: Diagnosis not present

## 2022-09-28 DIAGNOSIS — H31001 Unspecified chorioretinal scars, right eye: Secondary | ICD-10-CM | POA: Diagnosis not present

## 2022-10-02 ENCOUNTER — Other Ambulatory Visit: Payer: Self-pay | Admitting: Family Medicine

## 2022-10-02 ENCOUNTER — Ambulatory Visit (INDEPENDENT_AMBULATORY_CARE_PROVIDER_SITE_OTHER): Payer: No Typology Code available for payment source | Admitting: Family Medicine

## 2022-10-02 ENCOUNTER — Encounter: Payer: Self-pay | Admitting: Family Medicine

## 2022-10-02 VITALS — BP 166/88 | HR 73 | Ht 71.0 in | Wt 172.0 lb

## 2022-10-02 DIAGNOSIS — D509 Iron deficiency anemia, unspecified: Secondary | ICD-10-CM

## 2022-10-02 DIAGNOSIS — N1832 Chronic kidney disease, stage 3b: Secondary | ICD-10-CM | POA: Diagnosis not present

## 2022-10-02 DIAGNOSIS — E876 Hypokalemia: Secondary | ICD-10-CM | POA: Diagnosis not present

## 2022-10-02 DIAGNOSIS — I10 Essential (primary) hypertension: Secondary | ICD-10-CM | POA: Diagnosis not present

## 2022-10-02 MED ORDER — POTASSIUM CHLORIDE CRYS ER 10 MEQ PO TBCR: 10.0000 meq | EXTENDED_RELEASE_TABLET | Freq: Two times a day (BID) | ORAL | 5 refills | Status: DC

## 2022-10-02 NOTE — Progress Notes (Signed)
   Subjective:    Patient ID: Daryl Perkins, male    DOB: Apr 09, 1951, 72 y.o.   MRN: [id number]  HPI Patient arrives today for 6 week follow up. Patient states no concerns or issues today. History of stroke No new stroke symptoms recently He is on multiple medications and followed by hypertension clinic Blood pressure stays elevated Is actually much better than what it has been On most recent lab work that he had for eye surgery his potassium was low he states he is not taking any supplements Also hemoglobin low not sure if this is because of his renal insufficiency or potentially losing blood.  Will need further workup.  Is up-to-date on colonoscopy.   Review of Systems     Objective:   Physical Exam  General-in no acute distress Eyes-no discharge Lungs-respiratory rate normal, CTA CV-no murmurs,RRR Extremities skin warm dry no edema Neuro grossly normal Behavior normal, alert       Assessment & Plan:  1. Stage 3b chronic kidney disease Check lab work.  Healthy diet.  Minimize salt in diet.  2. Hypokalemia Go ahead and start potassium 10 mill equivalent 1 twice daily check magnesium check lab work await results - Magnesium  3. Uncontrolled hypertension Patient blood pressure related issues follows up with blood pressure clinic next week.  Continue current medications and potassium check labs - CBC with Differential - Basic Metabolic Panel (7)  4. Iron deficiency anemia, unspecified iron deficiency anemia type Check lab work to further workup.  May need further workup depending on the results. - Iron, TIBC and Ferritin Panel

## 2022-10-03 LAB — BASIC METABOLIC PANEL (7)
BUN/Creatinine Ratio: 10 (ref 10–24)
BUN: 16 mg/dL (ref 8–27)
CO2: 24 mmol/L (ref 20–29)
Chloride: 98 mmol/L (ref 96–106)
Creatinine, Ser: 1.68 mg/dL — ABNORMAL HIGH (ref 0.76–1.27)
Glucose: 106 mg/dL — ABNORMAL HIGH (ref 70–99)
Potassium: 3.2 mmol/L — ABNORMAL LOW (ref 3.5–5.2)
Sodium: 138 mmol/L (ref 134–144)
eGFR: 43 mL/min/{1.73_m2} — ABNORMAL LOW (ref >59)

## 2022-10-03 LAB — CBC WITH DIFFERENTIAL/PLATELET
Basophils Absolute: 0.1 10*3/uL (ref 0.0–0.2)
Basos: 1 %
EOS (ABSOLUTE): 0.1 10*3/uL (ref 0.0–0.4)
Eos: 2 %
Hematocrit: 31.4 % — ABNORMAL LOW (ref 37.5–51.0)
Hemoglobin: 10.6 g/dL — ABNORMAL LOW (ref 13.0–17.7)
Immature Grans (Abs): 0 10*3/uL (ref 0.0–0.1)
Immature Granulocytes: 0 %
Lymphocytes Absolute: 1.6 10*3/uL (ref 0.7–3.1)
Lymphs: 20 %
MCH: 28.3 pg (ref 26.6–33.0)
MCHC: 33.8 g/dL (ref 31.5–35.7)
MCV: 84 fL (ref 79–97)
Monocytes Absolute: 0.6 10*3/uL (ref 0.1–0.9)
Monocytes: 8 %
Neutrophils Absolute: 5.5 10*3/uL (ref 1.4–7.0)
Neutrophils: 69 %
Platelets: 272 10*3/uL (ref 150–450)
RBC: 3.74 x10E6/uL — ABNORMAL LOW (ref 4.14–5.80)
RDW: 13.8 % (ref 11.6–15.4)
WBC: 7.8 10*3/uL (ref 3.4–10.8)

## 2022-10-03 LAB — IRON,TIBC AND FERRITIN PANEL
Ferritin: 108 ng/mL (ref 30–400)
Iron Saturation: 20 % (ref 15–55)
Iron: 54 ug/dL (ref 38–169)
Total Iron Binding Capacity: 275 ug/dL (ref 250–450)
UIBC: 221 ug/dL (ref 111–343)

## 2022-10-03 LAB — MAGNESIUM: Magnesium: 2 mg/dL (ref 1.6–2.3)

## 2022-10-05 NOTE — Addendum Note (Signed)
Addended by: Dairl Ponder on: 10/05/2022 03:13 PM   Modules accepted: Orders

## 2022-10-11 ENCOUNTER — Ambulatory Visit (HOSPITAL_BASED_OUTPATIENT_CLINIC_OR_DEPARTMENT_OTHER): Payer: No Typology Code available for payment source | Admitting: Cardiovascular Disease

## 2022-11-02 DIAGNOSIS — H35371 Puckering of macula, right eye: Secondary | ICD-10-CM | POA: Diagnosis not present

## 2022-11-02 DIAGNOSIS — T85398D Other mechanical complication of other ocular prosthetic devices, implants and grafts, subsequent encounter: Secondary | ICD-10-CM | POA: Diagnosis not present

## 2022-11-02 DIAGNOSIS — Z9889 Other specified postprocedural states: Secondary | ICD-10-CM | POA: Diagnosis not present

## 2022-11-02 DIAGNOSIS — H3341 Traction detachment of retina, right eye: Secondary | ICD-10-CM | POA: Diagnosis not present

## 2022-11-29 ENCOUNTER — Encounter (HOSPITAL_BASED_OUTPATIENT_CLINIC_OR_DEPARTMENT_OTHER): Payer: Self-pay | Admitting: Cardiovascular Disease

## 2023-01-01 ENCOUNTER — Other Ambulatory Visit: Payer: Self-pay | Admitting: Family Medicine

## 2023-01-11 ENCOUNTER — Ambulatory Visit: Payer: No Typology Code available for payment source | Admitting: Neurology

## 2023-01-11 DIAGNOSIS — T85398D Other mechanical complication of other ocular prosthetic devices, implants and grafts, subsequent encounter: Secondary | ICD-10-CM | POA: Diagnosis not present

## 2023-01-11 DIAGNOSIS — H35342 Macular cyst, hole, or pseudohole, left eye: Secondary | ICD-10-CM | POA: Diagnosis not present

## 2023-01-11 DIAGNOSIS — H43812 Vitreous degeneration, left eye: Secondary | ICD-10-CM | POA: Diagnosis not present

## 2023-01-11 DIAGNOSIS — H35371 Puckering of macula, right eye: Secondary | ICD-10-CM | POA: Diagnosis not present

## 2023-02-02 ENCOUNTER — Ambulatory Visit: Payer: No Typology Code available for payment source | Admitting: Family Medicine

## 2023-02-06 ENCOUNTER — Ambulatory Visit: Payer: No Typology Code available for payment source | Admitting: Neurology

## 2023-02-19 ENCOUNTER — Encounter: Payer: Self-pay | Admitting: *Deleted

## 2023-02-19 DIAGNOSIS — Z006 Encounter for examination for normal comparison and control in clinical research program: Secondary | ICD-10-CM

## 2023-02-19 NOTE — Research (Signed)
Spoke with Daryl Perkins Daryl Perkins's niece about Daryl Perkins research study. She gave ok to send info about Daryl Perkins. E-mailed a copy of the consent for her and Daryl Perkins to look over. Encouraged her to call with any questions.

## 2023-02-20 ENCOUNTER — Other Ambulatory Visit (HOSPITAL_BASED_OUTPATIENT_CLINIC_OR_DEPARTMENT_OTHER): Payer: Self-pay | Admitting: Family

## 2023-02-20 DIAGNOSIS — I10 Essential (primary) hypertension: Secondary | ICD-10-CM

## 2023-02-20 NOTE — Telephone Encounter (Signed)
Please call pt to schedule overdue ADV HTN follow-up with Dr. Duke Salvia or Gillian Shields, NP for refills. Last OV 08/2022---was supposed to follow up 1 month after.

## 2023-02-22 ENCOUNTER — Encounter: Payer: Self-pay | Admitting: *Deleted

## 2023-02-22 DIAGNOSIS — Z006 Encounter for examination for normal comparison and control in clinical research program: Secondary | ICD-10-CM

## 2023-02-22 NOTE — Research (Signed)
Message left or Daryl Perkins's niece Daryl Perkins to see if he would like to come in for [employer] study.  Encouraged her to call back with any questions, or if he would like to come in for the study.

## 2023-03-02 ENCOUNTER — Ambulatory Visit (INDEPENDENT_AMBULATORY_CARE_PROVIDER_SITE_OTHER): Payer: No Typology Code available for payment source

## 2023-03-02 VITALS — Ht 71.0 in | Wt 180.0 lb

## 2023-03-02 DIAGNOSIS — Z0000 Encounter for general adult medical examination without abnormal findings: Secondary | ICD-10-CM

## 2023-03-02 NOTE — Patient Instructions (Signed)
Daryl Perkins , Thank you for taking time to come for your Medicare Wellness Visit. I appreciate your ongoing commitment to your health goals. Please review the following plan we discussed and let me know if I can assist you in the future.   Referrals/Orders/Follow-Ups/Clinician Recommendations:  An appt has been scheduled for you to follow up with Dr. Gerda Diss. Please keep this appointment.   This is a list of the screening recommended for you and due dates:  Health Maintenance  Topic Date Due   Hepatitis C Screening  Never done   Zoster (Shingles) Vaccine (1 of 2) Never done   Complete foot exam   03/19/2015   COVID-19 Vaccine (2 - Moderna risk series) 12/13/2019   Hemoglobin A1C  01/09/2023   Yearly kidney health urinalysis for diabetes  01/18/2023   Flu Shot  02/01/2023   Eye exam for diabetics  07/14/2023   Yearly kidney function blood test for diabetes  10/02/2023   Medicare Annual Wellness Visit  03/01/2024   Colon Cancer Screening  06/09/2025   DTaP/Tdap/Td vaccine (2 - Td or Tdap) 07/12/2032   Pneumonia Vaccine  Completed   HPV Vaccine  Aged Out    Advanced directives: (Declined) Advance directive discussed with you today. Even though you declined this today, please call our office should you change your mind, and we can give you the proper paperwork for you to fill out.  Next Medicare Annual Wellness Visit scheduled for next year: Yes Preventive Care 50 Years and Older, Male Preventive care refers to lifestyle choices and visits with your health care provider that can promote health and wellness. Preventive care visits are also called wellness exams. What can I expect for my preventive care visit? Counseling During your preventive care visit, your health care provider may ask about your: Medical history, including: Past medical problems. Family medical history. History of falls. Current health, including: Emotional well-being. Home life and relationship well-being. Sexual  activity. Memory and ability to understand (cognition). Lifestyle, including: Alcohol, nicotine or tobacco, and drug use. Access to firearms. Diet, exercise, and sleep habits. Work and work Astronomer. Sunscreen use. Safety issues such as seatbelt and bike helmet use. Physical exam Your health care provider will check your: Height and weight. These may be used to calculate your BMI (body mass index). BMI is a measurement that tells if you are at a healthy weight. Waist circumference. This measures the distance around your waistline. This measurement also tells if you are at a healthy weight and may help predict your risk of certain diseases, such as type 2 diabetes and high blood pressure. Heart rate and blood pressure. Body temperature. Skin for abnormal spots. What immunizations do I need?  Vaccines are usually given at various ages, according to a schedule. Your health care provider will recommend vaccines for you based on your age, medical history, and lifestyle or other factors, such as travel or where you work. What tests do I need? Screening Your health care provider may recommend screening tests for certain conditions. This may include: Lipid and cholesterol levels. Diabetes screening. This is done by checking your blood sugar (glucose) after you have not eaten for a while (fasting). Hepatitis C test. Hepatitis B test. HIV (human immunodeficiency virus) test. STI (sexually transmitted infection) testing, if you are at risk. Lung cancer screening. Colorectal cancer screening. Prostate cancer screening. Abdominal aortic aneurysm (AAA) screening. You may need this if you are a current or former smoker. Talk with your health care provider about  your test results, treatment options, and if necessary, the need for more tests. Follow these instructions at home: Eating and drinking  Eat a diet that includes fresh fruits and vegetables, whole grains, lean protein, and low-fat  dairy products. Limit your intake of foods with high amounts of sugar, saturated fats, and salt. Take vitamin and mineral supplements as recommended by your health care provider. Do not drink alcohol if your health care provider tells you not to drink. If you drink alcohol: Limit how much you have to 0-2 drinks a day. Know how much alcohol is in your drink. In the [place] one drink equals one 12 oz bottle of beer (355 mL), one 5 oz glass of wine (148 mL), or one 1 oz glass of hard liquor (44 mL). Lifestyle Brush your teeth every morning and night with fluoride toothpaste. Floss one time each day. Exercise for at least 30 minutes 5 or more days each week. Do not use any products that contain nicotine or tobacco. These products include cigarettes, chewing tobacco, and vaping devices, such as e-cigarettes. If you need help quitting, ask your health care provider. Do not use drugs. If you are sexually active, practice safe sex. Use a condom or other form of protection to prevent STIs. Take aspirin only as told by your health care provider. Make sure that you understand how much to take and what form to take. Work with your health care provider to find out whether it is safe and beneficial for you to take aspirin daily. Ask your health care provider if you need to take a cholesterol-lowering medicine (statin). Find healthy ways to manage stress, such as: Meditation, yoga, or listening to music. Journaling. Talking to a trusted person. Spending time with friends and family. Safety Always wear your seat belt while driving or riding in a vehicle. Do not drive: If you have been drinking alcohol. Do not ride with someone who has been drinking. When you are tired or distracted. While texting. If you have been using any mind-altering substances or drugs. Wear a helmet and other protective equipment during sports activities. If you have firearms in your house, make sure you follow all gun safety  procedures. Minimize exposure to UV radiation to reduce your risk of skin cancer. What's next? Visit your health care provider once a year for an annual wellness visit. Ask your health care provider how often you should have your eyes and teeth checked. Stay up to date on all vaccines. This information is not intended to replace advice given to you by your health care provider. Make sure you discuss any questions you have with your health care provider. Document Revised: 12/15/2020 Document Reviewed: 12/15/2020 Elsevier Patient Education  2024 [employer]. Understanding Your Risk for Falls Millions of people have serious injuries from falls each year. It is important to understand your risk of falling. Talk with your health care provider about your risk and what you can do to lower it. If you do have a serious fall, make sure to tell your provider. Falling once raises your risk of falling again. How can falls affect me? Serious injuries from falls are common. These include: Broken bones, such as hip fractures. Head injuries, such as traumatic brain injuries (TBI) or concussions. A fear of falling can cause you to avoid activities and stay at home. This can make your muscles weaker and raise your risk for a fall. What can increase my risk? There are a number of risk factors that increase  your risk for falling. The more risk factors you have, the higher your risk of falling. Serious injuries from a fall happen most often to people who are older than 71 years old. Teenagers and young adults ages 36-29 are also at higher risk. Common risk factors include: Weakness in the lower body. Being generally weak or confused due to long-term (chronic) illness. Dizziness or balance problems. Poor vision. Medicines that cause dizziness or drowsiness. These may include: Medicines for your blood pressure, heart, anxiety, insomnia, or swelling (edema). Pain medicines. Muscle relaxants. Other risk factors  include: Drinking alcohol. Having had a fall in the past. Having foot pain or wearing improper footwear. Working at a dangerous job. Having any of the following in your home: Tripping hazards, such as floor clutter or loose rugs. Poor lighting. Pets. Having dementia or memory loss. What actions can I take to lower my risk of falling?     Physical activity Stay physically fit. Do strength and balance exercises. Consider taking a regular class to build strength and balance. Yoga and tai chi are good options. Vision Have your eyes checked every year and your prescription for glasses or contacts updated as needed. Shoes and walking aids Wear non-skid shoes. Wear shoes that have rubber soles and low heels. Do not wear high heels. Do not walk around the house in socks or slippers. Use a cane or walker as told by your provider. Home safety Attach secure railings on both sides of your stairs. Install grab bars for your bathtub, shower, and toilet. Use a non-skid mat in your bathtub or shower. Attach bath mats securely with double-sided, non-slip rug tape. Use good lighting in all rooms. Keep a flashlight near your bed. Make sure there is a clear path from your bed to the bathroom. Use night-lights. Do not use throw rugs. Make sure all carpeting is taped or tacked down securely. Remove all clutter from walkways and stairways, including extension cords. Repair uneven or broken steps and floors. Avoid walking on icy or slippery surfaces. Walk on the grass instead of on icy or slick sidewalks. Use ice melter to get rid of ice on walkways in the winter. Use a cordless phone. Questions to ask your health care provider Can you help me check my risk for a fall? Do any of my medicines make me more likely to fall? Should I take a vitamin D supplement? What exercises can I do to improve my strength and balance? Should I make an appointment to have my vision checked? Do I need a bone density test  to check for weak bones (osteoporosis)? Would it help to use a cane or a walker? Where to find more information Centers for Disease Control and Prevention, [employer]AD[employer]: TonerPromos.no Community-Based Fall Prevention Programs: TonerPromos.no [employer] on Aging: BaseRingTones.pl Contact a health care provider if: You fall at home. You are afraid of falling at home. You feel weak, drowsy, or dizzy. This information is not intended to replace advice given to you by your health care provider. Make sure you discuss any questions you have with your health care provider. Document Revised: 02/20/2022 Document Reviewed: 02/20/2022 Elsevier Patient Education  2024 [employer].

## 2023-03-02 NOTE — Progress Notes (Signed)
 Because this visit was a virtual/telehealth visit,  certain criteria was not obtained, such a blood pressure, CBG if patient is a diabetic, and timed get up and go. Any medications not marked as "taking" was not mentioned during the medication reconciliation part of the visit. Any vitals not documented were not able to be obtained due to this being a telehealth visit. Vitals that have been documented are verbally provided by the patient.  Patient was unable to self-report a recent blood pressure reading due to a lack of equipment at home via telehealth.  Subjective:   Daryl Perkins is a 72 y.o. male who presents for Medicare Annual/Subsequent preventive examination.  Visit Complete: Virtual  I connected with  Jackqulyn Livings on 03/02/23 by a audio enabled telemedicine application and verified that I am speaking with the correct person using two identifiers.  Patient Location: Home  Provider Location: Home Office  I discussed the limitations of evaluation and management by telemedicine. The patient expressed understanding and agreed to proceed.  Patient Medicare AWV questionnaire was completed by the patient on na; I have confirmed that all information answered by patient is correct and no changes since this date.  Review of Systems     Cardiac Risk Factors include: advanced age (>78men, >71 women);hypertension;male gender;sedentary lifestyle     Objective:    Today's Vitals   03/02/23 1139  Weight: 180 lb (81.6 kg)  Height: 5\' 11"  (1.803 m)   Body mass index is 25.1 kg/m.     03/02/2023   11:42 AM 07/12/2022   10:05 AM 06/09/2020   10:34 AM 02/07/2017    7:09 PM 09/09/2012    6:25 AM 09/05/2012   10:51 AM  Advanced Directives  Does Patient Have a Medical Advance Directive? No No No No  Patient does not have advance directive;Patient would not like information  Would patient like information on creating a medical advance directive? No - Patient declined  No - Patient declined      Pre-existing out of facility DNR order (yellow form or pink MOST form)     No     Current Medications (verified) Outpatient Encounter Medications as of 03/02/2023  Medication Sig   amLODipine (NORVASC) 10 MG tablet Take 1 tablet (10 mg total) by mouth daily. (Patient taking differently: Take 10 mg by mouth at bedtime.)   doxazosin (CARDURA) 4 MG tablet Take 8 mg by mouth 2 (two) times daily.   potassium chloride (KLOR-CON M) 10 MEQ tablet Take 1 tablet (10 mEq total) by mouth 2 (two) times daily.   aspirin EC 81 MG tablet Take 81 mg by mouth daily. Swallow whole.   hydrALAZINE (APRESOLINE) 100 MG tablet Take 1 tablet (100 mg total) by mouth 3 (three) times daily. Take 1 tablet 3 times per day   indapamide (LOZOL) 2.5 MG tablet Take 1 tablet by mouth once daily   isosorbide mononitrate (IMDUR) 60 MG 24 hr tablet Take 1 tablet (60 mg total) by mouth daily.   rosuvastatin (CRESTOR) 40 MG tablet Take 1 tablet (40 mg total) by mouth daily.   No facility-administered encounter medications on file as of 03/02/2023.    Allergies (verified) Patient has no known allergies.   History: Past Medical History:  Diagnosis Date   Chronic kidney disease    HLD (hyperlipidemia)    HOH (hard of hearing)    does not wear hearing aids   Hypertension    Poor dental hygiene    Pre-diabetes  no meds, diet controlled   Prediabetes 12/09/2019   Stroke [facility])    Past Surgical History:  Procedure Laterality Date   AIR/FLUID EXCHANGE Right 09/21/2022   Procedure: AIR/FLUID EXCHANGE;  Surgeon: Shelah Lewandowsky, MD;  Location: [facility] OR;  Service: Ophthalmology;  Laterality: Right;   BACK SURGERY     1987 and 1989. lumbar disc   COLONOSCOPY N/A 06/09/2020   Procedure: COLONOSCOPY;  Surgeon: Corbin Ade, MD;  Location: [facility] ENDO SUITE;  Service: Endoscopy;  Laterality: N/A;  12:00   INGUINAL HERNIA REPAIR Right 09/09/2012   Procedure: HERNIA REPAIR INGUINAL ADULT;  Surgeon: Dalia Heading, MD;  Location: [facility] ORS;   Service: General;  Laterality: Right;   INJECTION OF SILICONE OIL Right 09/21/2022   Procedure: INJECTION OF SILICONE OIL;  Surgeon: Shelah Lewandowsky, MD;  Location: [facility] OR;  Service: Ophthalmology;  Laterality: Right;   INSERTION OF MESH Right 09/09/2012   Procedure: INSERTION OF MESH;  Surgeon: Dalia Heading, MD;  Location: [facility] ORS;  Service: General;  Laterality: Right;   MEMBRANE PEEL Right 09/21/2022   Procedure: MEMBRANE PEEL;  Surgeon: Shelah Lewandowsky, MD;  Location: [facility] OR;  Service: Ophthalmology;  Laterality: Right;   PARS PLANA VITRECTOMY Right 09/21/2022   Procedure: TWENTY-FIVE GAUGE PARS PLANA VITRECTOMY;  Surgeon: Shelah Lewandowsky, MD;  Location: [facility] OR;  Service: Ophthalmology;  Laterality: Right;   PHOTOCOAGULATION WITH LASER Right 09/21/2022   Procedure: PHOTOCOAGULATION WITH LASER;  Surgeon: Shelah Lewandowsky, MD;  Location: [facility] OR;  Service: Ophthalmology;  Laterality: Right;   POLYPECTOMY  06/09/2020   Procedure: POLYPECTOMY;  Surgeon: Corbin Ade, MD;  Location: [facility];  Service: Endoscopy;;  rectal, hepatic flexure   Family History  Problem Relation Age of Onset   Hypertension Sister    Heart attack Brother    Hypertension Niece    Social History   Socioeconomic History   Marital status: Divorced    Spouse name: Not on file   Number of children: Not on file   Years of education: Not on file   Highest education level: Not on file  Occupational History   Not on file  Tobacco Use   Smoking status: Never   Smokeless tobacco: Never  Vaping Use   Vaping status: Never Used  Substance and Sexual Activity   Alcohol use: No   Drug use: No   Sexual activity: Yes    Birth control/protection: None  Other Topics Concern   Not on file  Social History Narrative   Not on file   Social Determinants of Health   Financial Resource Strain: Low Risk  (03/02/2023)   Overall Financial Resource Strain (CARDIA)    Difficulty of Paying Living Expenses: Not hard at all  Food  Insecurity: No Food Insecurity (03/02/2023)   Hunger Vital Sign    Worried About Running Out of Food in the Last Year: Never true    Ran Out of Food in the Last Year: Never true  Transportation Needs: No Transportation Needs (03/02/2023)   PRAPARE - Administrator, Civil Service (Medical): No    Lack of Transportation (Non-Medical): No  Physical Activity: Sufficiently Active (03/02/2023)   Exercise Vital Sign    Days of Exercise per Week: 7 days    Minutes of Exercise per Session: 30 min  Stress: No Stress Concern Present (03/02/2023)   [employer] of Occupational Health - Occupational Stress Questionnaire    Feeling of Stress : Not  at all  Social Connections: Moderately Isolated (03/02/2023)   Social Connection and Isolation Panel [NHANES]    Frequency of Communication with Friends and Family: More than three times a week    Frequency of Social Gatherings with Friends and Family: More than three times a week    Attends Religious Services: More than 4 times per year    Active Member of [employer] or Organizations: No    Attends Engineer, structural: Never    Marital Status: Divorced    Tobacco Counseling Counseling given: Yes   Clinical Intake:  Pre-visit preparation completed: Yes  Pain : No/denies pain     BMI - recorded: 25.1 Nutritional Status: BMI 25 -29 Overweight Nutritional Risks: None Diabetes: No  How often do you need to have someone help you when you read instructions, pamphlets, or other written materials from your doctor or pharmacy?: 1 - Never  Interpreter Needed?: No  Information entered by ::  Shikha Bibb, CMa   Activities of Daily Living    03/02/2023   11:42 AM 09/21/2022    6:34 AM  In your present state of health, do you have any difficulty performing the following activities:  Hearing? 1   Comment no hearing aids   Vision? 0   Difficulty concentrating or making decisions? 0   Walking or climbing stairs? 0   Dressing or  bathing? 0   Doing errands, shopping? 1 0  Comment family takes him   Quarry manager and eating ? N   Using the Toilet? N   In the past six months, have you accidently leaked urine? N   Do you have problems with loss of bowel control? N   Managing your Medications? N   Managing your Finances? N   Housekeeping or managing your Housekeeping? N     Patient Care Team: Babs Sciara, MD as PCP - General (Family Medicine) Jena Gauss Gerrit Friends, MD as Consulting Physician (Gastroenterology)  Indicate any recent Medical Services you may have received from other than [facility] providers in the past year (date may be approximate).     Assessment:   This is a routine wellness examination for Berlon.  Hearing/Vision screen Hearing Screening - Comments:: Patient has hearing difficulties but does not wear hearing aids  Vision Screening - Comments:: Patient does not have any seeing difficulties. Can't remember the name of his eye doctor   Dietary issues and exercise activities discussed:     Goals Addressed             This Visit's Progress    Patient Stated       To see year 71       Depression Screen    03/02/2023   11:45 AM 10/02/2022    9:28 AM 08/17/2022   10:17 AM 07/19/2022   11:27 AM 05/04/2022    5:00 PM 05/04/2022    4:11 PM 04/25/2022   11:39 AM  PHQ 2/9 Scores  PHQ - 2 Score 0 0 0 0 0 0 0  PHQ- 9 Score  0 0 0 0 0     Fall Risk    03/02/2023   11:45 AM 10/02/2022    9:28 AM 08/17/2022   10:17 AM 07/19/2022   11:27 AM 05/04/2022    4:59 PM  Fall Risk   Falls in the past year? 0 0 1 1 1   Number falls in past yr: 0 0 0 1 1  Injury with Fall? 0 1 1 0 0  Risk for fall due to : No Fall Risks    No Fall Risks  Follow up Falls prevention discussed    Falls evaluation completed    MEDICARE RISK AT HOME: Medicare Risk at Home Any stairs in or around the home?: No If so, are there any without handrails?: No Home free of loose throw rugs in walkways, pet beds, electrical  cords, etc?: Yes Adequate lighting in your home to reduce risk of falls?: Yes Life alert?: No Use of a cane, walker or w/c?: No Grab bars in the bathroom?: No Shower chair or bench in shower?: No Elevated toilet seat or a handicapped toilet?: No  TIMED UP AND GO:  Was the test performed?  No    Cognitive Function:        03/02/2023   11:45 AM  6CIT Screen  What Year? 0 points  What month? 0 points  What time? 0 points  Count back from 20 0 points  Months in reverse 4 points  Repeat phrase 0 points  Total Score 4 points    Immunizations Immunization History  Administered Date(s) Administered   Fluad Quad(high Dose 65+) 04/12/2020, 05/04/2022   Influenza,inj,Quad PF,6+ Mos 03/18/2014, 04/17/2017, 05/29/2018   Moderna Sars-Covid-2 Vaccination 11/15/2019   Pneumococcal Conjugate-13 05/29/2018   Pneumococcal Polysaccharide-23 12/09/2019   Tdap 07/12/2022    TDAP status: Up to date  Flu Vaccine status: Due, Education has been provided regarding the importance of this vaccine. Advised may receive this vaccine at local pharmacy or Health Dept. Aware to provide a copy of the vaccination record if obtained from local pharmacy or Health Dept. Verbalized acceptance and understanding.  Pneumococcal vaccine status: Up to date  Covid-19 vaccine status: Information provided on how to obtain vaccines.   Qualifies for Shingles Vaccine? Yes   Zostavax completed No   Shingrix Completed?: No.    Education has been provided regarding the importance of this vaccine. Patient has been advised to call insurance company to determine out of pocket expense if they have not yet received this vaccine. Advised may also receive vaccine at local pharmacy or Health Dept. Verbalized acceptance and understanding.  Screening Tests Health Maintenance  Topic Date Due   Hepatitis C Screening  Never done   Zoster Vaccines- Shingrix (1 of 2) Never done   FOOT EXAM  03/19/2015   COVID-19 Vaccine (2 -  Moderna risk series) 12/13/2019   Medicare Annual Wellness (AWV)  12/08/2020   HEMOGLOBIN A1C  01/09/2023   Diabetic kidney evaluation - Urine ACR  01/18/2023   INFLUENZA VACCINE  02/01/2023   OPHTHALMOLOGY EXAM  07/14/2023   Diabetic kidney evaluation - eGFR measurement  10/02/2023   Colonoscopy  06/09/2025   DTaP/Tdap/Td (2 - Td or Tdap) 07/12/2032   Pneumonia Vaccine 32+ Years old  Completed   HPV VACCINES  Aged Out    Health Maintenance  Health Maintenance Due  Topic Date Due   Hepatitis C Screening  Never done   Zoster Vaccines- Shingrix (1 of 2) Never done   FOOT EXAM  03/19/2015   COVID-19 Vaccine (2 - Moderna risk series) 12/13/2019   Medicare Annual Wellness (AWV)  12/08/2020   HEMOGLOBIN A1C  01/09/2023   Diabetic kidney evaluation - Urine ACR  01/18/2023   INFLUENZA VACCINE  02/01/2023    Colorectal cancer screening: Type of screening: Colonoscopy. Completed 06/09/2020. Repeat every 5 years  Lung Cancer Screening: (Low Dose CT Chest recommended if Age 35-80 years, 20 pack-year currently smoking OR have  quit w/in 15years.) does not qualify.   Lung Cancer Screening Referral: na  Additional Screening:  Hepatitis C Screening: does qualify; ordered 03/02/2023  Vision Screening: Recommended annual ophthalmology exams for early detection of glaucoma and other disorders of the eye. Is the patient up to date with their annual eye exam?  Yes  Who is the provider or what is the name of the office in which the patient attends annual eye exams? Dr Irineo Axon If pt is not established with a provider, would they like to be referred to a provider to establish care? No .   Dental Screening: Recommended annual dental exams for proper oral hygiene  Diabetic Foot Exam: Diabetic Foot Exam: Overdue, Pt has been advised about the importance in completing this exam. Pt is scheduled for diabetic foot exam on provider made aware of need.  Community Resource Referral / Chronic Care  Management: CRR required this visit?  No   CCM required this visit?  No     Plan:     I have personally reviewed and noted the following in the patient's chart:   Medical and social history Use of alcohol, tobacco or illicit drugs  Current medications and supplements including opioid prescriptions. Patient is not currently taking opioid prescriptions. Functional ability and status Nutritional status Physical activity Advanced directives List of other physicians Hospitalizations, surgeries, and ER visits in previous 12 months Vitals Screenings to include cognitive, depression, and falls Referrals and appointments  In addition, I have reviewed and discussed with patient certain preventive protocols, quality metrics, and best practice recommendations. A written personalized care plan for preventive services as well as general preventive health recommendations were provided to patient.     Jordan Hawks Jed Kutch, CMA   03/02/2023   After Visit Summary: (MyChart) Due to this being a telephonic visit, the after visit summary with patients personalized plan was offered to patient via MyChart   Nurse Notes: Patient is due for an A1C and foot exam. Appt scheduled with you to address these and close health care gaps.

## 2023-03-27 NOTE — Telephone Encounter (Signed)
Called patient to schedule overdue HTN Clinic appt, niece states she will call back around her lunch break

## 2023-03-29 DIAGNOSIS — H35371 Puckering of macula, right eye: Secondary | ICD-10-CM | POA: Diagnosis not present

## 2023-03-29 DIAGNOSIS — H2513 Age-related nuclear cataract, bilateral: Secondary | ICD-10-CM | POA: Diagnosis not present

## 2023-03-29 DIAGNOSIS — T85398D Other mechanical complication of other ocular prosthetic devices, implants and grafts, subsequent encounter: Secondary | ICD-10-CM | POA: Diagnosis not present

## 2023-03-29 DIAGNOSIS — H43812 Vitreous degeneration, left eye: Secondary | ICD-10-CM | POA: Diagnosis not present

## 2023-03-29 DIAGNOSIS — H35342 Macular cyst, hole, or pseudohole, left eye: Secondary | ICD-10-CM | POA: Diagnosis not present

## 2023-03-30 ENCOUNTER — Other Ambulatory Visit: Payer: Self-pay | Admitting: Family Medicine

## 2023-03-31 ENCOUNTER — Other Ambulatory Visit: Payer: Self-pay | Admitting: Family Medicine

## 2023-04-04 ENCOUNTER — Ambulatory Visit: Payer: No Typology Code available for payment source | Admitting: Family Medicine

## 2023-04-19 ENCOUNTER — Encounter: Payer: Self-pay | Admitting: Cardiovascular Disease

## 2023-04-23 ENCOUNTER — Telehealth: Payer: Self-pay

## 2023-04-23 DIAGNOSIS — I10 Essential (primary) hypertension: Secondary | ICD-10-CM

## 2023-04-23 MED ORDER — ISOSORBIDE MONONITRATE ER 60 MG PO TB24: 60.0000 mg | ORAL_TABLET | Freq: Every day | ORAL | 0 refills | Status: DC

## 2023-04-23 NOTE — Telephone Encounter (Signed)
Isosorbide medication sent to [facility] pharmacy.

## 2023-05-08 ENCOUNTER — Encounter: Payer: Self-pay | Admitting: Family Medicine

## 2023-05-08 ENCOUNTER — Telehealth (HOSPITAL_BASED_OUTPATIENT_CLINIC_OR_DEPARTMENT_OTHER): Payer: Self-pay | Admitting: Family

## 2023-05-08 ENCOUNTER — Ambulatory Visit (INDEPENDENT_AMBULATORY_CARE_PROVIDER_SITE_OTHER): Payer: No Typology Code available for payment source | Admitting: Family Medicine

## 2023-05-08 VITALS — BP 166/72 | HR 70 | Temp 97.9°F | Ht 71.0 in | Wt 176.2 lb

## 2023-05-08 DIAGNOSIS — I10 Essential (primary) hypertension: Secondary | ICD-10-CM

## 2023-05-08 DIAGNOSIS — Z23 Encounter for immunization: Secondary | ICD-10-CM | POA: Diagnosis not present

## 2023-05-08 DIAGNOSIS — N1832 Chronic kidney disease, stage 3b: Secondary | ICD-10-CM

## 2023-05-08 MED ORDER — CARVEDILOL 3.125 MG PO TABS: 3.1250 mg | ORAL_TABLET | Freq: Two times a day (BID) | ORAL | 3 refills | Status: DC

## 2023-05-08 NOTE — Telephone Encounter (Addendum)
Returned call to niece (ok per DPR),   Last office visit for cards is 08/24/22 - start imdur 60 OD -direct ldl & lft that day  -Fu 1 mo (appointment missed- rescheduled for 06/2023)   Reviewed above information with niece. She verbalizes understanding.

## 2023-05-08 NOTE — Progress Notes (Signed)
   Subjective:    Patient ID: Daryl Perkins, male    DOB: 1951-06-07, 72 y.o.   MRN: [id number]  HPI  Here today because of elevated blood pressure Has CKD 3 Family tries to keep him on a regimented schedule with his medicines Also relates tries to eat healthy for the most part Blood pressure has been difficult to control he has seen specialist in the past Has CKD 3 Is on multiple agents Does do some walking Diet is fair could have more vegetables and fruits  Review of Systems     Objective:   Physical Exam General-in no acute distress Eyes-no discharge Lungs-respiratory rate normal, CTA CV-no murmurs,RRR Extremities skin warm dry no edema Neuro grossly normal Behavior normal, alert        Assessment & Plan:  1. Immunization due Today - Flu Vaccine Trivalent High Dose (Fluad)  2. Uncontrolled hypertension Add Coreg 3.125 twice daily continue other medication Follow-up in December Please bring all medications to that visit Also information given so they can set himself up with Dr. Allyson Sabal cardiology services for vascular ultrasound of renal artery  3. Stage 3b chronic kidney disease (HCC) Lab work ordered await results Consider spironolactone - Basic Metabolic Panel - Microalbumin/Creatinine Ratio, Urine

## 2023-05-08 NOTE — Telephone Encounter (Signed)
Patient's niece calling to get information on the patients last visit.

## 2023-05-09 LAB — BASIC METABOLIC PANEL
BUN/Creatinine Ratio: 11 (ref 10–24)
BUN: 20 mg/dL (ref 8–27)
CO2: 25 mmol/L (ref 20–29)
Calcium: 9.9 mg/dL (ref 8.6–10.2)
Chloride: 98 mmol/L (ref 96–106)
Creatinine, Ser: 1.82 mg/dL — ABNORMAL HIGH (ref 0.76–1.27)
Glucose: 102 mg/dL — ABNORMAL HIGH (ref 70–99)
Potassium: 3.8 mmol/L (ref 3.5–5.2)
Sodium: 138 mmol/L (ref 134–144)
eGFR: 39 mL/min/{1.73_m2} — ABNORMAL LOW (ref >59)

## 2023-05-09 LAB — MICROALBUMIN / CREATININE URINE RATIO
Creatinine, Urine: 124.7 mg/dL
Microalb/Creat Ratio: 32 mg/g{creat} — ABNORMAL HIGH (ref 0–29)
Microalbumin, Urine: 39.4 ug/mL

## 2023-05-10 NOTE — Progress Notes (Signed)
Called patient and left message for patient to call office. Nurse Note* The urine test shows improvement kidney function stable please find out from his family-we was the last time he saw the kidney specialist?  Does he follow-up there on a regular basis? )

## 2023-05-11 ENCOUNTER — Other Ambulatory Visit: Payer: Self-pay | Admitting: Family Medicine

## 2023-05-23 ENCOUNTER — Encounter: Payer: Self-pay | Admitting: *Deleted

## 2023-06-11 DIAGNOSIS — H35342 Macular cyst, hole, or pseudohole, left eye: Secondary | ICD-10-CM | POA: Diagnosis not present

## 2023-06-11 DIAGNOSIS — H43812 Vitreous degeneration, left eye: Secondary | ICD-10-CM | POA: Diagnosis not present

## 2023-06-11 DIAGNOSIS — T85398A Other mechanical complication of other ocular prosthetic devices, implants and grafts, initial encounter: Secondary | ICD-10-CM | POA: Diagnosis not present

## 2023-06-11 DIAGNOSIS — H259 Unspecified age-related cataract: Secondary | ICD-10-CM | POA: Diagnosis not present

## 2023-06-11 DIAGNOSIS — H35371 Puckering of macula, right eye: Secondary | ICD-10-CM | POA: Diagnosis not present

## 2023-06-11 DIAGNOSIS — E119 Type 2 diabetes mellitus without complications: Secondary | ICD-10-CM | POA: Diagnosis not present

## 2023-06-14 ENCOUNTER — Ambulatory Visit: Payer: No Typology Code available for payment source | Admitting: Family Medicine

## 2023-06-28 ENCOUNTER — Encounter (HOSPITAL_BASED_OUTPATIENT_CLINIC_OR_DEPARTMENT_OTHER): Payer: No Typology Code available for payment source | Admitting: Family

## 2023-06-28 ENCOUNTER — Ambulatory Visit (INDEPENDENT_AMBULATORY_CARE_PROVIDER_SITE_OTHER): Payer: No Typology Code available for payment source | Admitting: Family Medicine

## 2023-06-28 VITALS — BP 146/78 | HR 69 | Temp 97.9°F | Ht 71.0 in | Wt 175.0 lb

## 2023-06-28 DIAGNOSIS — N1832 Chronic kidney disease, stage 3b: Secondary | ICD-10-CM | POA: Diagnosis not present

## 2023-06-28 DIAGNOSIS — H532 Diplopia: Secondary | ICD-10-CM | POA: Diagnosis not present

## 2023-06-28 DIAGNOSIS — I10 Essential (primary) hypertension: Secondary | ICD-10-CM | POA: Diagnosis not present

## 2023-06-28 DIAGNOSIS — E1122 Type 2 diabetes mellitus with diabetic chronic kidney disease: Secondary | ICD-10-CM | POA: Diagnosis not present

## 2023-06-28 DIAGNOSIS — D631 Anemia in chronic kidney disease: Secondary | ICD-10-CM

## 2023-06-28 NOTE — Progress Notes (Signed)
   Subjective:    Patient ID: Daryl Perkins, male    DOB: [birth year], 72 y.o.   MRN: [id number]  Discussed the use of AI scribe software for clinical note transcription with the patient, who gave verbal consent to proceed.  History of Present Illness   The patient, with a history of kidney disease and hypertension, has been maintaining a consistent medication regimen and reports no recent health setbacks. He has been engaging in daily morning walks and maintaining a healthy diet. The patient's kidney function has remained stable over the past year, with creatinine levels fluctuating between 1.68 and 2.14.  The patient has been experiencing double vision, which has been partially managed by looking out of the right eye. He has undergone two surgeries in March, which have reportedly improved the condition.  The patient's blood pressure reading during the visit was 146/76, which is an improvement from previous readings. He has been reminded to be mindful of his sugar intake, as his A1c levels have been hovering around 6.0 for the past few years. The patient's bowel movements and urination have been normal.     Essential hypertension, benign  Stage 3b chronic kidney disease (HCC) - Plan: Basic Metabolic Panel, Phosphorus, Magnesium, PTH, intact and calcium  Anemia in stage 3b chronic kidney disease (HCC) - Plan: CBC with Differential  Double vision  Type 2 diabetes mellitus with stage 3a chronic kidney disease, without long-term current use of insulin (HCC) - Plan: Hemoglobin A1c  Family relates compliance with his medicines He does walk on a regular basis He sees cardiology periodically Has not seen renal doctor recently Denies any major setbacks Recent lab work reviewed with patient  Review of Systems     Objective:    Physical Exam   VITALS: BP- 146/76    General-in no acute distress Eyes-no discharge Lungs-respiratory rate normal, CTA CV-no murmurs,RRR Extremities skin warm  dry no edema Neuro grossly normal Behavior normal, alert        Assessment & Plan:  Assessment and Plan    Chronic Kidney Disease Stable creatinine levels over the past year. No new symptoms reported. -Continue current management and medications. -Check renal function before next visit.  Hypertension Blood pressure well controlled at today's visit (146/76). -Continue current antihypertensive regimen.  Pre-Diabetes Previous A1c levels around 6.0-6.5. Patient reports adherence to healthy diet. -Check A1c at next blood draw to monitor for progression to diabetes.  Bilateral Cataracts Surgeries scheduled for March 12th and March 26th. -Post-operative follow-up in April.  General Health Maintenance / Followup Plans -Cardiology follow-up scheduled for February 13th. -Follow-up in this clinic in April. -Complete blood work prior to April visit.     Patient to do comprehensive blood work before next visit Blood pressure readings surprisingly good given his underlying health issues and resistant to treat hypertension Patient able to keep diabetes under good control with diet alone currently

## 2023-06-29 ENCOUNTER — Other Ambulatory Visit: Payer: Self-pay | Admitting: Family Medicine

## 2023-06-29 ENCOUNTER — Other Ambulatory Visit (HOSPITAL_BASED_OUTPATIENT_CLINIC_OR_DEPARTMENT_OTHER): Payer: Self-pay | Admitting: Family

## 2023-07-02 ENCOUNTER — Other Ambulatory Visit: Payer: Self-pay | Admitting: *Deleted

## 2023-07-02 DIAGNOSIS — I723 Aneurysm of iliac artery: Secondary | ICD-10-CM

## 2023-07-18 ENCOUNTER — Other Ambulatory Visit: Payer: Self-pay | Admitting: Family

## 2023-07-18 DIAGNOSIS — I10 Essential (primary) hypertension: Secondary | ICD-10-CM

## 2023-08-08 ENCOUNTER — Other Ambulatory Visit: Payer: Self-pay | Admitting: Family Medicine

## 2023-08-16 ENCOUNTER — Ambulatory Visit (HOSPITAL_BASED_OUTPATIENT_CLINIC_OR_DEPARTMENT_OTHER): Payer: Medicare PPO | Admitting: Family

## 2023-08-16 ENCOUNTER — Encounter (HOSPITAL_BASED_OUTPATIENT_CLINIC_OR_DEPARTMENT_OTHER): Payer: Self-pay | Admitting: Family

## 2023-08-16 VITALS — BP 208/95 | HR 68 | Ht 71.0 in | Wt 182.0 lb

## 2023-08-16 DIAGNOSIS — Z8673 Personal history of transient ischemic attack (TIA), and cerebral infarction without residual deficits: Secondary | ICD-10-CM

## 2023-08-16 DIAGNOSIS — I700 Atherosclerosis of aorta: Secondary | ICD-10-CM

## 2023-08-16 DIAGNOSIS — E785 Hyperlipidemia, unspecified: Secondary | ICD-10-CM

## 2023-08-16 DIAGNOSIS — I10 Essential (primary) hypertension: Secondary | ICD-10-CM | POA: Diagnosis not present

## 2023-08-16 DIAGNOSIS — I493 Ventricular premature depolarization: Secondary | ICD-10-CM | POA: Diagnosis not present

## 2023-08-16 MED ORDER — CLONIDINE HCL 0.1 MG PO TABS
0.1000 mg | ORAL_TABLET | Freq: Once | ORAL | Status: AC
  Administered 2023-08-16: 0.1 mg via ORAL

## 2023-08-16 NOTE — Progress Notes (Signed)
Advanced Hypertension Clinic Assessment:    Date:  08/16/2023   ID:  Daryl Perkins, DOB [birth year], MRN [id number]  PCP:  Babs Sciara, MD  Cardiologist:  None  Nephrologist:  Referring MD: Babs Sciara, MD   CC: Hypertension  History of Present Illness:    Daryl Perkins is a 72 y.o. male with a hx of HTN, CVA, CKD, aortic atherosclerosis, syncope here to follow up in the Advanced Hypertension Clinic. Family history of heart disease, hypertension.    Prior CT 01/2017 with aortic atherosclerosis. Prior 04/05/22 renal duplex with no stenosis and minimal iliac artery aneurysm.  Established with ADV HTN clinic 06/08/22.  Daryl Perkins was diagnosed with hypertension in his 24s. Niece helps him with pill pack to promote compliance. He was walking for exercise. He did note a prior episode of syncope in October. Hydralazine increased to 100mg  AM, 50mg  afternoon, 100mg  PM. Amlodipine, Doxazosin, Indapamide, Spironolactone were continued. 14 day ZIO ordered forprior syncope as well as cardiac CTA due to TWI lateral leads. Rosuvastatin was increased due to LDL 138. Spironolactone stopped due to renal function and Hydralazine increased to 100mg  TID. CTA put on hold due to renal function. He did not have repeat BMP collected.  Monitor with predominantly normal sinus rhythm and 2 runs of VT with longest/of 6 beats at 185 bpm and 20 runs of SVT. No changes were recommended.  Last seen 08/24/2022.  BP not at goal, Imdur 60 mg daily initiated.  Recommended follow-up in 1 month which was not completed.  He was given information about renal denervation to review.  Due to new T wave inversion in lateral leads Myoview performed 08/2022 which was low risk study with no evidence of ischemia.  Presents today for follow up with his niece. Reports compliance with medications, niece manages pill box for him. Has been walking daily for exercise. Eating at home and out. Low salt diet at home but not always when  eating out. For example, yesterday had ribs. Took his medication at 7am, his niece arranges his pill box for him. Has not been checking BP at home.   Previous antihypertensives: Losartan -  HCTZ -  Spironolactone - discontinued due to renal function   Past Medical History:  Diagnosis Date   Chronic kidney disease    HLD (hyperlipidemia)    HOH (hard of hearing)    does not wear hearing aids   Hypertension    Poor dental hygiene    Pre-diabetes    no meds, diet controlled   Prediabetes 12/09/2019   Stroke [facility])     Past Surgical History:  Procedure Laterality Date   AIR/FLUID EXCHANGE Right 09/21/2022   Procedure: AIR/FLUID EXCHANGE;  Surgeon: Shelah Lewandowsky, MD;  Location: [facility] OR;  Service: Ophthalmology;  Laterality: Right;   BACK SURGERY     1987 and 1989. lumbar disc   COLONOSCOPY N/A 06/09/2020   Procedure: COLONOSCOPY;  Surgeon: Corbin Ade, MD;  Location: [facility] SU[facility];  Service: Endoscopy;  Laterality: N/A;  12:00   INGUINAL HERNIA REPAIR Right 09/09/2012   Procedure: HERNIA REPAIR INGUINAL ADULT;  Surgeon: Dalia Heading, MD;  Location: [facility] ORS;  Service: General;  Laterality: Right;   INJECTION OF SILICONE OIL Right 09/21/2022   Procedure: INJECTION OF SILICONE OIL;  Surgeon: Shelah Lewandowsky, MD;  Location: [facility] OR;  Service: Ophthalmology;  Laterality: Right;   INSERTION OF MESH Right 09/09/2012   Procedure: INSERTION OF MESH;  Surgeon:  Dalia Heading, MD;  Location: [facility] ORS;  Service: General;  Laterality: Right;   MEMBRANE PEEL Right 09/21/2022   Procedure: MEMBRANE PEEL;  Surgeon: Shelah Lewandowsky, MD;  Location: [facility] OR;  Service: Ophthalmology;  Laterality: Right;   PARS PLANA VITRECTOMY Right 09/21/2022   Procedure: TWENTY-FIVE GAUGE PARS PLANA VITRECTOMY;  Surgeon: Shelah Lewandowsky, MD;  Location: [facility] OR;  Service: Ophthalmology;  Laterality: Right;   PHOTOCOAGULATION WITH LASER Right 09/21/2022   Procedure: PHOTOCOAGULATION WITH LASER;  Surgeon: Shelah Lewandowsky, MD;  Location: [facility]  OR;  Service: Ophthalmology;  Laterality: Right;   POLYPECTOMY  06/09/2020   Procedure: POLYPECTOMY;  Surgeon: Corbin Ade, MD;  Location: [facility];  Service: Endoscopy;;  rectal, hepatic flexure    Current Medications: No outpatient medications have been marked as taking for the 08/16/23 encounter (Office Visit) with Alver Sorrow, NP.     Allergies:   Patient has no known allergies.   Social History   Socioeconomic History   Marital status: Divorced    Spouse name: Not on file   Number of children: Not on file   Years of education: Not on file   Highest education level: Not on file  Occupational History   Not on file  Tobacco Use   Smoking status: Never   Smokeless tobacco: Never  Vaping Use   Vaping status: Never Used  Substance and Sexual Activity   Alcohol use: No   Drug use: No   Sexual activity: Yes    Birth control/protection: None  Other Topics Concern   Not on file  Social History Narrative   Not on file   Social Drivers of Health   Financial Resource Strain: Low Risk  (03/02/2023)   Overall Financial Resource Strain (CARDIA)    Difficulty of Paying Living Expenses: Not hard at all  Food Insecurity: No Food Insecurity (03/02/2023)   Hunger Vital Sign    Worried About Running Out of Food in the Last Year: Never true    Ran Out of Food in the Last Year: Never true  Transportation Needs: No Transportation Needs (03/02/2023)   PRAPARE - Administrator, Civil Service (Medical): No    Lack of Transportation (Non-Medical): No  Physical Activity: Sufficiently Active (03/02/2023)   Exercise Vital Sign    Days of Exercise per Week: 7 days    Minutes of Exercise per Session: 30 min  Stress: No Stress Concern Present (03/02/2023)   [employer] of Occupational Health - Occupational Stress Questionnaire    Feeling of Stress : Not at all  Social Connections: Moderately Isolated (03/02/2023)   Social Connection and Isolation Panel [NHANES]     Frequency of Communication with Friends and Family: More than three times a week    Frequency of Social Gatherings with Friends and Family: More than three times a week    Attends Religious Services: More than 4 times per year    Active Member of [employer] or Organizations: No    Attends Banker Meetings: Never    Marital Status: Divorced     Family History: The patient's family history includes Heart attack in his brother; Hypertension in his niece and sister.  ROS:   Please see the history of present illness.     All other systems reviewed and are negative.  EKGs/Labs/Other Studies Reviewed:    EKG:  EKG is ordered today.  The ekg ordered today demonstrates NSR 85 bpm with PVC and TWI  V4-V6.   Recent Labs: 08/24/2022: ALT 10 10/02/2022: Hemoglobin 10.6; Magnesium 2.0; Platelets 272 05/08/2023: BUN 20; Creatinine, Ser 1.82; Potassium 3.8; Sodium 138   Recent Lipid Panel    Component Value Date/Time   CHOL 246 (H) 01/17/2022 1042   TRIG 54 01/17/2022 1042   HDL 51 01/17/2022 1042   CHOLHDL 4.8 01/17/2022 1042   CHOLHDL 4.5 08/08/2013 1246   VLDL 24 08/08/2013 1246   LDLCALC 186 (H) 01/17/2022 1042   LDLDIRECT 97 08/24/2022 1129    Physical Exam:   VS:  There were no vitals taken for this visit. , BMI There is no height or weight on file to calculate BMI. GENERAL:  Well appearing HEENT: Pupils equal round and reactive, fundi not visualized, oral mucosa unremarkable. R eye patch in place. Steri strips to left side of forehead with no bleeding.  NECK:  No jugular venous distention, waveform within normal limits, carotid upstroke brisk and symmetric, no bruits, no thyromegaly LYMPHATICS:  No cervical adenopathy LUNGS:  Clear to auscultation bilaterally HEART:  RRR.  PMI not displaced or sustained,S1 and S2 within normal limits, no S3, no S4, no clicks, no rubs, no murmurs ABD:  Flat, positive bowel sounds normal in frequency in pitch, no bruits, no rebound, no guarding,  no midline pulsatile mass, no hepatomegaly, no splenomegaly EXT:  2 plus pulses throughout, no edema, no cyanosis no clubbing SKIN:  No rashes no nodules NEURO:  Cranial nerves II through XII grossly intact, motor grossly intact throughout PSYCH:  Cognitively intact, oriented to person place and time   ASSESSMENT/PLAN:    HTN - BP not at goal of <130/80. Anticipate markedly elevated today due to high sodium intake yesterday of ribs. Required 0.1mg  Clonidine in clinic today.  Continue amlodipine 10mg  QD, Doxazosin 4mg  BID, Hydralazine 100mg  TID, Indapamide 2.5mg  daily, Coreg 3.125mg  BID, Imdur 60mg  daily.  Resume home BP monitoring. Check in via MyChart in 1 week. If BP not at goal, plan to increase IMdur dose.  Plan to complete ADV HTN research study at follow up  Secondary workup: No snoring suggestive of OSA. 03/2022 TSH 3.5. 04/05/22 no renal artery stenosis and minimal iliac artery aneurysm.   PVC / Syncope  - PVC noted on EKG. No recurrent syncope. Reports rare palpitations. Continue Coreg 3.125mg  BID.  Abnormal EKG - EKG 12/7/23with new TWI lateral leads.  08/2018 for my review low risk study. Stable with no anginal symptoms. No indication for ischemic evaluation.    Hx of CVA / Aortic atherosclerosis / HLD, LDL goal <70 - Continue aspirin, Rosuvastatin.   CKD - Careful titration of diuretic and antihypertensive. Has labs upcoming with PCP.  Screening for Secondary Hypertension:  Click here to document screening for secondary causes of HTN  :[id number]}  Relevant Labs/Studies:    Latest Ref Rng & Units 05/08/2023   10:51 AM 10/02/2022   10:22 AM 09/21/2022    6:21 AM  Basic Labs  Sodium 134 - 144 mmol/L 138  138  137   Potassium 3.5 - 5.2 mmol/L 3.8  3.2  2.8   Creatinine 0.76 - 1.27 mg/dL 4.09  8.11  9.14        Latest Ref Rng & Units 03/29/2022    8:47 AM  Thyroid   TSH 0.450 - 4.500 uIU/mL 3.520        Latest Ref Rng & Units 02/06/2022   10:01 AM  Renin/Aldosterone    Aldosterone 0.0 - 30.0 ng/dL 6.3   Renin 7.829 -  5.380 ng/mL/hr 2.351   Aldos/Renin Ratio 0.0 - 30.0 2.7        Latest Ref Rng & Units 02/06/2022   10:01 AM  Metanephrines/Catecholamines   Metanephrines 0.0 - 88.0 pg/mL 60.2   Normetanephrines  0.0 - 285.2 pg/mL 100.6           04/05/2022    8:28 AM  Renovascular   Renal Artery [place] Completed Yes      Disposition:    FU with MD/PharmD in 2-4 weeks   Medication Adjustments/Labs and Tests Ordered: Current medicines are reviewed at length with the patient today.  Concerns regarding medicines are outlined above.  Orders Placed This Encounter  Procedures   EKG 12-Lead   No orders of the defined types were placed in this encounter.    Signed, Alver Sorrow, NP  08/16/2023 11:02 AM    [facility]

## 2023-08-16 NOTE — Patient Instructions (Signed)
Medication Instructions:  We have given you clonidine today in clinic for your elevated blood pressure.    Follow-Up: Please follow up in 2-4 weeks with Dr. Duke Salvia or Gillian Shields, NP in [facility] HTN CLINIC   Special Instructions:  We will send a mychart message in 1 weeks to check on blood pressure

## 2023-08-23 ENCOUNTER — Encounter (HOSPITAL_BASED_OUTPATIENT_CLINIC_OR_DEPARTMENT_OTHER): Payer: Self-pay

## 2023-09-17 ENCOUNTER — Encounter (HOSPITAL_BASED_OUTPATIENT_CLINIC_OR_DEPARTMENT_OTHER): Payer: Medicare PPO | Admitting: Cardiovascular Disease

## 2023-09-26 ENCOUNTER — Other Ambulatory Visit: Payer: Self-pay | Admitting: Family Medicine

## 2023-09-28 ENCOUNTER — Other Ambulatory Visit: Payer: Self-pay | Admitting: Family Medicine

## 2023-09-29 ENCOUNTER — Other Ambulatory Visit (HOSPITAL_BASED_OUTPATIENT_CLINIC_OR_DEPARTMENT_OTHER): Payer: Self-pay | Admitting: Family

## 2023-09-29 DIAGNOSIS — E782 Mixed hyperlipidemia: Secondary | ICD-10-CM

## 2023-09-29 DIAGNOSIS — I10 Essential (primary) hypertension: Secondary | ICD-10-CM

## 2023-09-29 DIAGNOSIS — R7303 Prediabetes: Secondary | ICD-10-CM

## 2023-10-11 LAB — CBC WITH DIFFERENTIAL/PLATELET
Basophils Absolute: 0.1 10*3/uL (ref 0.0–0.2)
Basos: 1 %
EOS (ABSOLUTE): 0.3 10*3/uL (ref 0.0–0.4)
Eos: 4 %
Hematocrit: 39.2 % (ref 37.5–51.0)
Hemoglobin: 12.5 g/dL — ABNORMAL LOW (ref 13.0–17.7)
Immature Grans (Abs): 0 10*3/uL (ref 0.0–0.1)
Immature Granulocytes: 0 %
Lymphocytes Absolute: 1.8 10*3/uL (ref 0.7–3.1)
Lymphs: 26 %
MCH: 28.2 pg (ref 26.6–33.0)
MCHC: 31.9 g/dL (ref 31.5–35.7)
MCV: 88 fL (ref 79–97)
Monocytes Absolute: 0.5 10*3/uL (ref 0.1–0.9)
Monocytes: 7 %
Neutrophils Absolute: 4.4 10*3/uL (ref 1.4–7.0)
Neutrophils: 62 %
Platelets: 337 10*3/uL (ref 150–450)
RBC: 4.44 x10E6/uL (ref 4.14–5.80)
RDW: 14.1 % (ref 11.6–15.4)
WBC: 7.1 10*3/uL (ref 3.4–10.8)

## 2023-10-11 LAB — HEMOGLOBIN A1C
Est. average glucose Bld gHb Est-mCnc: 126 mg/dL
Hgb A1c MFr Bld: 6 % — ABNORMAL HIGH (ref 4.8–5.6)

## 2023-10-11 LAB — MAGNESIUM: Magnesium: 2 mg/dL (ref 1.6–2.3)

## 2023-10-11 LAB — BASIC METABOLIC PANEL WITH GFR
BUN/Creatinine Ratio: 10 (ref 10–24)
BUN: 15 mg/dL (ref 8–27)
CO2: 25 mmol/L (ref 20–29)
Calcium: 9.8 mg/dL (ref 8.6–10.2)
Chloride: 99 mmol/L (ref 96–106)
Creatinine, Ser: 1.54 mg/dL — ABNORMAL HIGH (ref 0.76–1.27)
Glucose: 110 mg/dL — ABNORMAL HIGH (ref 70–99)
Potassium: 4.1 mmol/L (ref 3.5–5.2)
Sodium: 139 mmol/L (ref 134–144)
eGFR: 48 mL/min/{1.73_m2} — ABNORMAL LOW (ref >59)

## 2023-10-11 LAB — PTH, INTACT AND CALCIUM: PTH: 40 pg/mL (ref 15–65)

## 2023-10-11 LAB — PHOSPHORUS: Phosphorus: 3 mg/dL (ref 2.8–4.1)

## 2023-10-12 ENCOUNTER — Encounter: Payer: Self-pay | Admitting: Family Medicine

## 2023-10-17 ENCOUNTER — Ambulatory Visit: Payer: No Typology Code available for payment source | Admitting: Family Medicine

## 2023-10-17 VITALS — BP 141/80 | HR 72 | Temp 98.8°F | Ht 71.0 in | Wt 174.4 lb

## 2023-10-17 DIAGNOSIS — E1169 Type 2 diabetes mellitus with other specified complication: Secondary | ICD-10-CM | POA: Diagnosis not present

## 2023-10-17 DIAGNOSIS — I10 Essential (primary) hypertension: Secondary | ICD-10-CM

## 2023-10-17 DIAGNOSIS — E1122 Type 2 diabetes mellitus with diabetic chronic kidney disease: Secondary | ICD-10-CM | POA: Diagnosis not present

## 2023-10-17 DIAGNOSIS — E785 Hyperlipidemia, unspecified: Secondary | ICD-10-CM

## 2023-10-17 DIAGNOSIS — M21612 Bunion of left foot: Secondary | ICD-10-CM | POA: Diagnosis not present

## 2023-10-17 DIAGNOSIS — N1831 Type 2 diabetes mellitus with diabetic chronic kidney disease: Secondary | ICD-10-CM

## 2023-10-17 DIAGNOSIS — I723 Aneurysm of iliac artery: Secondary | ICD-10-CM

## 2023-10-17 NOTE — Progress Notes (Signed)
 Subjective:    Patient ID: Daryl Perkins, male    DOB: 1950-12-10, 73 y.o.   MRN: [id number]  HPI Pt comes in today for 4 month follow up. Pt has no questions or concerns at this time. Allergies and medications have been reviewed. Discussed the use of AI scribe software for clinical note transcription with the patient, who gave verbal consent to proceed.  History of Present Illness   Daryl Perkins is a 73 year old male with hypertension and diabetes who presents for a routine follow-up visit.  He is doing well with no new concerns or setbacks. He adheres to his medication regimen and is mindful of his diet, particularly in managing salt and sugar intake. He uses a pill box for his medications, which his sister helps organize.  He attends a blood pressure clinic periodically, with the last visit being three to four months ago. His blood pressure today is 146/86 mmHg, showing improvement from previous readings.  Recent laboratory results indicate improvement in kidney function, with a creatinine level of 1.54 mg/dL and a GFR of 48 NW/GNF/6.21H, better than five months ago. His hemoglobin has increased from 10.6 g/dL to 08.6 g/dL. His A1c has decreased from 6.6% in 2021 to 6.0%, reflecting good diabetes control. Other lab results, including sodium, potassium, phosphorus, and magnesium levels, are within normal limits.  No swelling in his ankles.       Review of Systems     Objective:   Physical Exam  General-in no acute distress Eyes-no discharge Lungs-respiratory rate normal, CTA CV-no murmurs,RRR Extremities skin warm dry no edema Neuro grossly normal Behavior normal, alert Diabetic foot exam completed      Assessment & Plan:   1. Uncontrolled hypertension (Primary) A1c actually under good control blood pressure much better compared to how it normally has with still not within guidelines but once again much better than what it used to be  2. Type 2 diabetes mellitus  with stage 3a chronic kidney disease, without long-term current use of insulin (HCC) A1c under good control healthy diet no medications indicated  3. Bunion of great toe of left foot Motioning of feet prevention of infection discussed  4. Hyperlipidemia associated with type 2 diabetes mellitus (HCC) Continue medication healthy diet   Assessment and Plan    Type 2 Diabetes Mellitus Diabetes well-controlled. Hemoglobin A1c improved to 6.0%. - Continue current diabetes management plan. - Encourage adherence to low-sugar diet. - Monitor blood glucose levels regularly.  Chronic Kidney Disease Stage 3 Kidney function improved. Creatinine 1.54 mg/dL, GFR 48 mL/min. - Continue current management for chronic kidney disease. - Monitor kidney function regularly.  Hypertension Blood pressure well-controlled. Recent reading 146/86 mmHg. - Continue current antihypertensive medications. - Encourage adherence to low-salt diet. - Recheck blood pressure at next visit.  Anemia Anemia improved. Hemoglobin increased to 12.5 g/dL. - Continue current management for anemia. - Monitor hemoglobin levels regularly.  General Health Maintenance Maintaining healthy lifestyle. Foot care education provided. - Encourage daily foot lotion application to prevent dry skin and potential infections. - Advise on signs of foot infection and need for prompt medical attention. - Encourage regular physical activity, such as walking, while avoiding overheating in summer.  Follow-up - Schedule follow-up appointment in the fall. - Order blood work to be done one week prior to the next office visit.      We will also go ahead with iliac artery ultrasound due to iliac artery aneurysm Recheck in 6 months

## 2023-10-29 ENCOUNTER — Telehealth: Payer: Self-pay | Admitting: *Deleted

## 2023-10-29 NOTE — Telephone Encounter (Signed)
 Minimal iliac artery aneurysm  Reimage in 1 year  Please put into the reminder file for a vascular ultrasound of iliac artery in 1 year (Vascular U/S Renal Arterial Duplex)  This was due 04/2023 and has been ordered and scheduled several times without sucess

## 2023-10-30 ENCOUNTER — Encounter: Payer: Self-pay | Admitting: Family Medicine

## 2023-10-30 NOTE — Telephone Encounter (Signed)
 A letter should be sent certified-it was dictated today.  This is encouraging the patient to get a iliac artery ultrasound completed.  Please send certified mail.

## 2023-11-05 ENCOUNTER — Other Ambulatory Visit: Payer: Self-pay

## 2023-11-05 DIAGNOSIS — I723 Aneurysm of iliac artery: Secondary | ICD-10-CM

## 2023-11-06 DIAGNOSIS — H2513 Age-related nuclear cataract, bilateral: Secondary | ICD-10-CM | POA: Diagnosis not present

## 2023-11-07 ENCOUNTER — Other Ambulatory Visit: Payer: Self-pay | Admitting: Family Medicine

## 2023-11-21 DIAGNOSIS — E1122 Type 2 diabetes mellitus with diabetic chronic kidney disease: Secondary | ICD-10-CM | POA: Diagnosis not present

## 2023-11-21 DIAGNOSIS — H268 Other specified cataract: Secondary | ICD-10-CM | POA: Diagnosis not present

## 2023-11-21 DIAGNOSIS — H259 Unspecified age-related cataract: Secondary | ICD-10-CM | POA: Diagnosis not present

## 2023-11-21 DIAGNOSIS — E1136 Type 2 diabetes mellitus with diabetic cataract: Secondary | ICD-10-CM | POA: Diagnosis not present

## 2023-11-21 DIAGNOSIS — N1832 Chronic kidney disease, stage 3b: Secondary | ICD-10-CM | POA: Diagnosis not present

## 2023-11-21 DIAGNOSIS — I129 Hypertensive chronic kidney disease with stage 1 through stage 4 chronic kidney disease, or unspecified chronic kidney disease: Secondary | ICD-10-CM | POA: Diagnosis not present

## 2023-11-22 DIAGNOSIS — T85398A Other mechanical complication of other ocular prosthetic devices, implants and grafts, initial encounter: Secondary | ICD-10-CM | POA: Diagnosis not present

## 2023-11-22 DIAGNOSIS — H2522 Age-related cataract, morgagnian type, left eye: Secondary | ICD-10-CM | POA: Diagnosis not present

## 2023-11-22 DIAGNOSIS — H35342 Macular cyst, hole, or pseudohole, left eye: Secondary | ICD-10-CM | POA: Diagnosis not present

## 2023-11-22 DIAGNOSIS — E119 Type 2 diabetes mellitus without complications: Secondary | ICD-10-CM | POA: Diagnosis not present

## 2023-11-22 DIAGNOSIS — Z961 Presence of intraocular lens: Secondary | ICD-10-CM | POA: Diagnosis not present

## 2023-11-22 DIAGNOSIS — H43812 Vitreous degeneration, left eye: Secondary | ICD-10-CM | POA: Diagnosis not present

## 2023-11-22 DIAGNOSIS — Z9841 Cataract extraction status, right eye: Secondary | ICD-10-CM | POA: Diagnosis not present

## 2023-11-22 DIAGNOSIS — H35371 Puckering of macula, right eye: Secondary | ICD-10-CM | POA: Diagnosis not present

## 2023-11-26 ENCOUNTER — Other Ambulatory Visit: Payer: Self-pay | Admitting: Family

## 2023-11-26 DIAGNOSIS — I10 Essential (primary) hypertension: Secondary | ICD-10-CM

## 2023-11-29 DIAGNOSIS — Z961 Presence of intraocular lens: Secondary | ICD-10-CM | POA: Diagnosis not present

## 2023-11-29 DIAGNOSIS — E119 Type 2 diabetes mellitus without complications: Secondary | ICD-10-CM | POA: Diagnosis not present

## 2023-11-29 DIAGNOSIS — H2522 Age-related cataract, morgagnian type, left eye: Secondary | ICD-10-CM | POA: Diagnosis not present

## 2023-11-29 DIAGNOSIS — Z9841 Cataract extraction status, right eye: Secondary | ICD-10-CM | POA: Diagnosis not present

## 2023-11-29 DIAGNOSIS — H35371 Puckering of macula, right eye: Secondary | ICD-10-CM | POA: Diagnosis not present

## 2023-11-29 DIAGNOSIS — H43812 Vitreous degeneration, left eye: Secondary | ICD-10-CM | POA: Diagnosis not present

## 2023-11-29 DIAGNOSIS — T85398A Other mechanical complication of other ocular prosthetic devices, implants and grafts, initial encounter: Secondary | ICD-10-CM | POA: Diagnosis not present

## 2023-11-29 DIAGNOSIS — H35342 Macular cyst, hole, or pseudohole, left eye: Secondary | ICD-10-CM | POA: Diagnosis not present

## 2023-12-05 DIAGNOSIS — H269 Unspecified cataract: Secondary | ICD-10-CM | POA: Diagnosis not present

## 2023-12-05 DIAGNOSIS — H35371 Puckering of macula, right eye: Secondary | ICD-10-CM | POA: Diagnosis not present

## 2023-12-05 DIAGNOSIS — H2522 Age-related cataract, morgagnian type, left eye: Secondary | ICD-10-CM | POA: Diagnosis not present

## 2023-12-05 DIAGNOSIS — H35342 Macular cyst, hole, or pseudohole, left eye: Secondary | ICD-10-CM | POA: Diagnosis not present

## 2023-12-05 DIAGNOSIS — H268 Other specified cataract: Secondary | ICD-10-CM | POA: Diagnosis not present

## 2023-12-05 DIAGNOSIS — Z9841 Cataract extraction status, right eye: Secondary | ICD-10-CM | POA: Diagnosis not present

## 2023-12-05 DIAGNOSIS — Z961 Presence of intraocular lens: Secondary | ICD-10-CM | POA: Diagnosis not present

## 2023-12-05 DIAGNOSIS — H2512 Age-related nuclear cataract, left eye: Secondary | ICD-10-CM | POA: Diagnosis not present

## 2023-12-05 DIAGNOSIS — T85398A Other mechanical complication of other ocular prosthetic devices, implants and grafts, initial encounter: Secondary | ICD-10-CM | POA: Diagnosis not present

## 2023-12-05 DIAGNOSIS — E119 Type 2 diabetes mellitus without complications: Secondary | ICD-10-CM | POA: Diagnosis not present

## 2023-12-05 DIAGNOSIS — H43812 Vitreous degeneration, left eye: Secondary | ICD-10-CM | POA: Diagnosis not present

## 2023-12-22 ENCOUNTER — Other Ambulatory Visit: Payer: Self-pay | Admitting: Family Medicine

## 2024-02-15 ENCOUNTER — Other Ambulatory Visit: Payer: Self-pay | Admitting: Family

## 2024-02-15 DIAGNOSIS — I10 Essential (primary) hypertension: Secondary | ICD-10-CM

## 2024-02-29 ENCOUNTER — Other Ambulatory Visit: Payer: Self-pay

## 2024-02-29 MED ORDER — CARVEDILOL 3.125 MG PO TABS: 3.1250 mg | ORAL_TABLET | Freq: Two times a day (BID) | ORAL | 3 refills | Status: DC

## 2024-04-18 ENCOUNTER — Ambulatory Visit: Admitting: Family Medicine

## 2024-05-28 ENCOUNTER — Other Ambulatory Visit (HOSPITAL_BASED_OUTPATIENT_CLINIC_OR_DEPARTMENT_OTHER): Payer: Self-pay | Admitting: Family

## 2024-05-28 ENCOUNTER — Other Ambulatory Visit: Payer: Self-pay | Admitting: Family Medicine

## 2024-06-02 ENCOUNTER — Other Ambulatory Visit: Payer: Self-pay | Admitting: Family Medicine

## 2024-06-02 MED ORDER — CARVEDILOL 3.125 MG PO TABS: 3.1250 mg | ORAL_TABLET | Freq: Two times a day (BID) | ORAL | 1 refills | Status: AC

## 2024-06-20 ENCOUNTER — Ambulatory Visit

## 2024-07-25 ENCOUNTER — Ambulatory Visit: Admitting: Family Medicine

## 2024-09-18 ENCOUNTER — Ambulatory Visit

## 2024-09-29 ENCOUNTER — Ambulatory Visit: Admitting: Family Medicine
